# Patient Record
Sex: Female | Born: 1985 | Race: White | Hispanic: No | Marital: Married | Smoking: Never smoker
Health system: Southern US, Community
[De-identification: ages and names within clinical notes are randomized; demographics above are authoritative.]

## PROBLEM LIST (undated history)

## (undated) DIAGNOSIS — Z973 Presence of spectacles and contact lenses: Secondary | ICD-10-CM

## (undated) HISTORY — PX: APPENDECTOMY: SHX54

---

## 2007-04-08 ENCOUNTER — Emergency Department (HOSPITAL_COMMUNITY): Admission: EM | Admit: 2007-04-08 | Discharge: 2007-04-08 | Payer: Self-pay | Admitting: Emergency Medicine

## 2007-09-09 ENCOUNTER — Emergency Department (HOSPITAL_COMMUNITY): Admission: EM | Admit: 2007-09-09 | Discharge: 2007-09-09 | Payer: Self-pay | Admitting: *Deleted

## 2010-10-03 ENCOUNTER — Observation Stay: Payer: Self-pay | Admitting: Vascular Surgery

## 2010-10-06 LAB — PATHOLOGY REPORT

## 2011-12-22 ENCOUNTER — Ambulatory Visit (INDEPENDENT_AMBULATORY_CARE_PROVIDER_SITE_OTHER): Payer: BC Managed Care – PPO | Admitting: Family Medicine

## 2011-12-22 ENCOUNTER — Other Ambulatory Visit (HOSPITAL_COMMUNITY)
Admission: RE | Admit: 2011-12-22 | Discharge: 2011-12-22 | Disposition: A | Discharge status: Home / Self Care | Payer: BC Managed Care – PPO | Source: Ambulatory Visit | Attending: Family Medicine | Admitting: Family Medicine

## 2011-12-22 ENCOUNTER — Encounter: Payer: Self-pay | Admitting: Family Medicine

## 2011-12-22 VITALS — BP 110/70 | HR 60 | Temp 98.1°F | Ht 61.25 in | Wt 107.0 lb

## 2011-12-22 DIAGNOSIS — Z139 Encounter for screening, unspecified: Secondary | ICD-10-CM

## 2011-12-22 DIAGNOSIS — Z01419 Encounter for gynecological examination (general) (routine) without abnormal findings: Secondary | ICD-10-CM | POA: Insufficient documentation

## 2011-12-22 DIAGNOSIS — Z0000 Encounter for general adult medical examination without abnormal findings: Secondary | ICD-10-CM

## 2011-12-22 MED ORDER — NORGESTIMATE-ETH ESTRADIOL 0.25-35 MG-MCG PO TABS: 1.0000 | ORAL_TABLET | Freq: Every day | ORAL | Status: DC

## 2011-12-22 NOTE — Progress Notes (Signed)
Subjective:    Patient ID: Allison Carney, female    DOB: 03/25/1986, 26 y.o.   MRN: [id number]  HPI  26 yo G0  here to establish care and for CPX.  No h/o abnormal pap smears, has been married since August.  Brings in lab work from her insurer- labs are excellent!  Take Sprintec for OCPs, working well and migraines have improved since she started taking. Used to get menstrual migraine once monthly, now has a migraine every 1-2 months. Associated with Aura, visual changes, nausea and vomiting.  There is no problem list on file for this patient.  Past Medical History  Diagnosis Date  . Migraine    Past Surgical History  Procedure Date  . Appendectomy    History  Substance Use Topics  . Smoking status: Never Smoker   . Smokeless tobacco: Not on file  . Alcohol Use: Not on file   No family history on file. No Known Allergies  Current outpatient prescriptions:norgestimate-ethinyl estradiol (ORTHO-CYCLEN,SPRINTEC,PREVIFEM) 0.25-35 MG-MCG tablet, Take 1 tablet by mouth daily., Disp: 1 Package, Rfl: 11;  SUMAtriptan (IMITREX) 100 MG tablet, Take one at onset of headache., Disp: , Rfl:   The PMH, PSH, Social History, Family History, Medications, and allergies have been reviewed in [facility], and have been updated if relevant.   Review of Systems See HPI Patient reports no  vision/ hearing changes,anorexia, weight change, fever ,adenopathy, persistant / recurrent hoarseness, swallowing issues, chest pain, edema,persistant / recurrent cough, hemoptysis, dyspnea(rest, exertional, paroxysmal nocturnal), gastrointestinal  bleeding (melena, rectal bleeding), abdominal pain, excessive heart burn, GU symptoms(dysuria, hematuria, pyuria, voiding/incontinence  Issues) syncope, focal weakness, severe memory loss, concerning skin lesions, depression, anxiety, abnormal bruising/bleeding, major joint swelling, breast masses or abnormal vaginal bleeding.       Objective:   Physical Exam BP 110/70   Pulse 60  Temp(Src) 98.1 F (36.7 C) (Oral)  Ht 5' 1.25" (1.556 m)  Wt 107 lb (48.535 kg)  BMI 20.05 kg/m2  General:  Well-developed,well-nourished,in no acute distress; alert,appropriate and cooperative throughout examination Head:  normocephalic and atraumatic.   Eyes:  vision grossly intact, pupils equal, pupils round, and pupils reactive to light.   Ears:  R ear normal and L ear normal.   Nose:  no external deformity.   Mouth:  good dentition.   Neck:  No deformities, masses, or tenderness noted. Breasts:  No mass, nodules, thickening, tenderness, bulging, retraction, inflamation, nipple discharge or skin changes noted.   Lungs:  Normal respiratory effort, chest expands symmetrically. Lungs are clear to auscultation, no crackles or wheezes. Heart:  Normal rate and regular rhythm. S1 and S2 normal without gallop, murmur, click, rub or other extra sounds. Abdomen:  Bowel sounds positive,abdomen soft and non-tender without masses, organomegaly or hernias noted. Rectal:  no external abnormalities.   Genitalia:  Pelvic Exam:        External: normal female genitalia without lesions or masses        Vagina: normal without lesions or masses        Cervix: normal without lesions or masses        Adnexa: normal bimanual exam without masses or fullness        Uterus: normal by palpation        Pap smear: performed Msk:  No deformity or scoliosis noted of thoracic or lumbar spine.   Extremities:  No clubbing, cyanosis, edema, or deformity noted with normal full range of motion of all joints.   Neurologic:  alert &  oriented X3 and gait normal.   Skin:  Intact without suspicious lesions or rashes Cervical Nodes:  No lymphadenopathy noted Axillary Nodes:  No palpable lymphadenopathy Psych:  Cognition and judgment appear intact. Alert and cooperative with normal attention span and concentration. No apparent delusions, illusions, hallucinations  Assessment and Plan:         Assessment &  Plan:   1. Unspecified general medical examination  Cytology - PAP   Reviewed preventive care protocols, scheduled due services, and updated immunizations Discussed nutrition, exercise, diet, and healthy lifestyle.

## 2011-12-22 NOTE — Patient Instructions (Signed)
Wonderful to meet you. We will call you or send a letter with your pap smear results (can take 1-2 weeks). Please keep a headache journal for me and call me in a few months.

## 2011-12-27 ENCOUNTER — Encounter: Payer: Self-pay | Admitting: *Deleted

## 2012-01-12 ENCOUNTER — Encounter: Payer: Self-pay | Admitting: Family Medicine

## 2012-01-12 ENCOUNTER — Ambulatory Visit (INDEPENDENT_AMBULATORY_CARE_PROVIDER_SITE_OTHER): Payer: BC Managed Care – PPO | Admitting: Family Medicine

## 2012-01-12 VITALS — BP 118/62 | HR 74 | Temp 98.1°F | Ht 61.25 in | Wt 111.2 lb

## 2012-01-12 DIAGNOSIS — A499 Bacterial infection, unspecified: Secondary | ICD-10-CM

## 2012-01-12 DIAGNOSIS — J329 Chronic sinusitis, unspecified: Secondary | ICD-10-CM

## 2012-01-12 DIAGNOSIS — H6122 Impacted cerumen, left ear: Secondary | ICD-10-CM

## 2012-01-12 DIAGNOSIS — H6120 Impacted cerumen, unspecified ear: Secondary | ICD-10-CM

## 2012-01-12 DIAGNOSIS — B9689 Other specified bacterial agents as the cause of diseases classified elsewhere: Secondary | ICD-10-CM | POA: Insufficient documentation

## 2012-01-12 MED ORDER — AMOXICILLIN-POT CLAVULANATE 875-125 MG PO TABS: 1.0000 | ORAL_TABLET | Freq: Two times a day (BID) | ORAL | Status: AC

## 2012-01-12 NOTE — Assessment & Plan Note (Signed)
Deep and dry appearing inst to use debrox in L ear twice weekly for 2-4 wk and f/u for irrigation

## 2012-01-12 NOTE — Patient Instructions (Addendum)
Take augmentin for sinus infection and finish it all  Keep trying to use nasal saline or netti pot Naproxen is ok - for facial pain and congestion  Drink lots of fluids  Breathe stream and use warm compresses on face  Update if not starting to improve in a week or if worsening   Also get debrox ear solution over the counter and use as directed in left ear twice a week to loosen wax and then follow up with DR Dayton Martes in 2-4 weeks to have ear irrigated  Use back up birth control while on the antibiotic

## 2012-01-12 NOTE — Assessment & Plan Note (Signed)
Will tx with augmentin Pt aware of pot interaction with OC and inst to use condoms also Disc symptomatic care - see instructions on AVS  Update if not starting to improve in a week or if worsening

## 2012-01-12 NOTE — Progress Notes (Signed)
  Subjective:    Patient ID: Allison Carney, female    DOB: [birth year], 26 y.o.   MRN: [id number]  HPI Started getting uri symptoms over a week ago  Nose is congested and L nostril feels very swollen Yellow and green nasal drainage  Pain in face is bad - worse under her eyes  No fever   A little cough- not too bad so far/ non prod  Throat is a bit sore Ears feel full- no pain or drainage   Does have ear wax too  Cannot hear well out of her L ear and it feels fullness/ pressure  No pain   Patient Active Problem List  Diagnoses  . Unspecified general medical examination  . Sinusitis, bacterial  . Impacted cerumen of left ear   Past Medical History  Diagnosis Date  . Migraine    Past Surgical History  Procedure Date  . Appendectomy    History  Substance Use Topics  . Smoking status: Never Smoker   . Smokeless tobacco: Not on file  . Alcohol Use: Not on file   No family history on file. No Known Allergies Current Outpatient Prescriptions on File Prior to Visit  Medication Sig Dispense Refill  . norgestimate-ethinyl estradiol (ORTHO-CYCLEN,SPRINTEC,PREVIFEM) 0.25-35 MG-MCG tablet Take 1 tablet by mouth daily.  1 Package  11  . SUMAtriptan (IMITREX) 100 MG tablet Take one at onset of headache.         Review of Systems Review of Systems  Constitutional: Negative for fever, appetite change, and unexpected weight change. pos for fatigue  Eyes: Negative for pain and visual disturbance.  ENT pos for congestion/ rhinorrhea/ purulent drainage and facial pain and reduced hearing  Respiratory: Negative for sob or wheeze .   Cardiovascular: Negative for cp or palpitations    Gastrointestinal: Negative for nausea, diarrhea and constipation.  Genitourinary: Negative for urgency and frequency.  Skin: Negative for pallor or rash   Neurological: Negative for weakness, light-headedness, numbness and headaches.  Hematological: Negative for adenopathy. Does not bruise/bleed easily.   Psychiatric/Behavioral: Negative for dysphoric mood. The patient is not nervous/anxious.          Objective:   Physical Exam  Constitutional: She appears well-developed and well-nourished. No distress.  HENT:  Head: Normocephalic and atraumatic.  Right Ear: External ear normal.  Nose: Nose normal.  Mouth/Throat: Oropharynx is clear and moist.       Deep/ dry cerumen impaction L ear with reduced hearing  R TM clear Nares are injected and congested  bilat maxillary sinus tenderness Post nasal drip  Eyes: Conjunctivae and EOM are normal. Pupils are equal, round, and reactive to light. Right eye exhibits no discharge. Left eye exhibits no discharge.  Neck: Normal range of motion. Neck supple. No JVD present. No thyromegaly present.  Cardiovascular: Normal rate and regular rhythm.   Pulmonary/Chest: Effort normal and breath sounds normal. No respiratory distress. She has no wheezes. She has no rales.  Lymphadenopathy:    She has no cervical adenopathy.  Neurological: She is alert. She has normal reflexes. No cranial nerve deficit.  Skin: Skin is warm and dry. No rash noted.  Psychiatric: She has a normal mood and affect.          Assessment & Plan:

## 2012-02-04 ENCOUNTER — Ambulatory Visit: Payer: BC Managed Care – PPO | Admitting: Family Medicine

## 2012-02-07 ENCOUNTER — Ambulatory Visit (INDEPENDENT_AMBULATORY_CARE_PROVIDER_SITE_OTHER): Payer: BC Managed Care – PPO | Admitting: Family Medicine

## 2012-02-07 ENCOUNTER — Encounter: Payer: Self-pay | Admitting: Family Medicine

## 2012-02-07 VITALS — BP 102/70 | HR 64 | Temp 97.6°F | Wt 108.0 lb

## 2012-02-07 DIAGNOSIS — H6120 Impacted cerumen, unspecified ear: Secondary | ICD-10-CM

## 2012-02-07 NOTE — Progress Notes (Signed)
  Subjective:    Patient ID: Allison Carney, female    DOB: [birth year], 26 y.o.   MRN: [id number]  HPI 25 here for left cerumen impaction.    Has been using debrox with no improvement.  Cannot hear well out of her L ear and it feels fullness/ pressure  No pain   Patient Active Problem List  Diagnoses  . Unspecified general medical examination  . Sinusitis, bacterial  . Impacted cerumen of left ear   Past Medical History  Diagnosis Date  . Migraine    Past Surgical History  Procedure Date  . Appendectomy    History  Substance Use Topics  . Smoking status: Never Smoker   . Smokeless tobacco: Not on file  . Alcohol Use: Not on file   No family history on file. No Known Allergies Current Outpatient Prescriptions on File Prior to Visit  Medication Sig Dispense Refill  . norgestimate-ethinyl estradiol (ORTHO-CYCLEN,SPRINTEC,PREVIFEM) 0.25-35 MG-MCG tablet Take 1 tablet by mouth daily.  1 Package  11  . SUMAtriptan (IMITREX) 100 MG tablet Take one at onset of headache.         Review of Systems See HPI        Objective:   Physical Exam  BP 102/70  Pulse 64  Temp(Src) 97.6 F (36.4 C) (Oral)  Wt 108 lb (48.988 kg)  Constitutional: She appears well-developed and well-nourished. No distress.  HENT:  Head: Normocephalic and atraumatic.  Right Ear: External ear normal.  Nose: Nose normal.  Mouth/Throat: Oropharynx is clear and moist.       Deep/ dry cerumen impaction L ear with reduced hearing  Skin: Skin is warm and dry. No rash noted.  Psychiatric: She has a normal mood and affect.       Assessment & Plan:  1.  Cerumen impaction, left-  New-  Wax is removed by syringing and manual debridement. Instructions for home care to prevent wax buildup are given.

## 2012-10-29 ENCOUNTER — Encounter (HOSPITAL_COMMUNITY): Payer: Self-pay

## 2012-10-29 ENCOUNTER — Emergency Department (HOSPITAL_COMMUNITY)
Admission: EM | Admit: 2012-10-29 | Discharge: 2012-10-29 | Disposition: A | Discharge status: Home / Self Care | Payer: BC Managed Care – PPO | Attending: Emergency Medicine | Admitting: Emergency Medicine

## 2012-10-29 DIAGNOSIS — J029 Acute pharyngitis, unspecified: Secondary | ICD-10-CM | POA: Insufficient documentation

## 2012-10-29 DIAGNOSIS — R509 Fever, unspecified: Secondary | ICD-10-CM | POA: Insufficient documentation

## 2012-10-29 DIAGNOSIS — J3489 Other specified disorders of nose and nasal sinuses: Secondary | ICD-10-CM | POA: Insufficient documentation

## 2012-10-29 DIAGNOSIS — Z8679 Personal history of other diseases of the circulatory system: Secondary | ICD-10-CM | POA: Insufficient documentation

## 2012-10-29 MED ORDER — LIDOCAINE VISCOUS 2 % MT SOLN: 15.0000 mL | OROMUCOSAL | Status: DC | PRN

## 2012-10-29 NOTE — ED Provider Notes (Signed)
History   This chart was scribed for Wynetta Emery, PA by Leone Payor, ED Scribe. This patient was seen in room TR08C/TR08C and the patient's care was started at 2232  CSN: [id number]  Arrival date & time 10/29/12  2122   First MD Initiated Contact with Patient 10/29/12 2232      Chief Complaint  Patient presents with  . Sore Throat     The history is provided by the patient. No language interpreter was used.    Allison Carney is a 27 y.o. female who presents to the Emergency Department complaining of a new, unchanged sore throat starting 1 day ago with associated fever (TMAX 101) and nasal drainage. Pt reports taking alka seltzer and day time nasal congestion medication with mild relief. She denies cough, nausea, vomiting, diarrhea.   Pt has h/o migraines and appendectomy.  Pt denies smoking and alcohol use.  Past Medical History  Diagnosis Date  . Migraine     Past Surgical History  Procedure Date  . Appendectomy     History reviewed. No pertinent family history.  History  Substance Use Topics  . Smoking status: Never Smoker   . Smokeless tobacco: Not on file  . Alcohol Use: No    No OB history provided.   Review of Systems  Constitutional: Positive for fever.  HENT: Positive for sore throat.   Respiratory: Negative for cough and shortness of breath.   Cardiovascular: Negative for chest pain.  Gastrointestinal: Negative for nausea, vomiting, abdominal pain and diarrhea.  All other systems reviewed and are negative.    Allergies  Review of patient's allergies indicates no known allergies.  Home Medications   Current Outpatient Rx  Name  Route  Sig  Dispense  Refill  . FEXOFENADINE-PSEUDOEPHED ER 60-120 MG PO TB12   Oral   Take 1 tablet by mouth daily as needed. For allergies         . NORGESTIMATE-ETH ESTRADIOL 0.25-35 MG-MCG PO TABS   Oral   Take 1 tablet by mouth daily.   1 Package   11   . SUMATRIPTAN SUCCINATE 100 MG PO TABS   Oral  Take 100 mg by mouth every 2 (two) hours as needed. For migraines           BP 128/91  Pulse 103  Temp 99.3 F (37.4 C) (Oral)  Resp 18  SpO2 100%  LMP 10/29/2012  Physical Exam  Nursing note and vitals reviewed. Constitutional: She is oriented to person, place, and time. She appears well-developed and well-nourished. No distress.  HENT:  Head: Normocephalic.  Right Ear: External ear normal.  Left Ear: External ear normal.  Nose: Nose normal.  Mouth/Throat: Oropharynx is clear and moist. No oropharyngeal exudate.  Eyes: Conjunctivae normal and EOM are normal. Pupils are equal, round, and reactive to light.  Neck: Normal range of motion. Neck supple.  Cardiovascular: Normal rate.   Pulmonary/Chest: Effort normal and breath sounds normal. No stridor. No respiratory distress. She has no wheezes. She has no rales. She exhibits no tenderness.  Abdominal: Soft. Bowel sounds are normal. She exhibits no distension and no mass. There is no tenderness. There is no rebound and no guarding.  Musculoskeletal: Normal range of motion.  Lymphadenopathy:    She has no cervical adenopathy.  Neurological: She is alert and oriented to person, place, and time.  Psychiatric: She has a normal mood and affect.    ED Course  Procedures (including critical care time)  DIAGNOSTIC STUDIES:  Oxygen Saturation is 100% on room air, normal by my interpretation.    COORDINATION OF CARE:   11:11 PM Discussed treatment plan which includes high dose of motrin and viscous lidocaine with pt at bedside and pt agreed to plan.     Labs Reviewed  RAPID STREP SCREEN  LAB REPORT - SCANNED   No results found.   1. Pharyngitis       MDM    Pt verbalized understanding and agrees with care plan. Outpatient follow-up and return precautions given.    New Prescriptions   LIDOCAINE (XYLOCAINE) 2 % SOLUTION    Take 15 mLs by mouth every 3 (three) hours as needed for pain. Swish and swallow    I  personally performed the services described in this documentation, which was scribed in my presence. The recorded information has been reviewed and is accurate.   Wynetta Emery, PA-C 11/01/12 [phone]

## 2012-10-29 NOTE — ED Notes (Signed)
Pt reports low grade fever, sore throat and nasal drainage starting last night. Pt sts "my throat burns."

## 2012-11-02 NOTE — ED Provider Notes (Signed)
Medical screening examination/treatment/procedure(s) were performed by non-physician practitioner and as supervising physician I was immediately available for consultation/collaboration.   Jahniya Duzan E Itzell Bendavid, MD 11/02/12 0838 

## 2012-11-22 ENCOUNTER — Other Ambulatory Visit: Payer: Self-pay | Admitting: Family Medicine

## 2013-01-17 ENCOUNTER — Other Ambulatory Visit: Payer: Self-pay | Admitting: Family Medicine

## 2013-02-17 ENCOUNTER — Other Ambulatory Visit: Payer: Self-pay | Admitting: Family Medicine

## 2013-04-11 ENCOUNTER — Other Ambulatory Visit: Payer: Self-pay | Admitting: Family Medicine

## 2013-04-16 ENCOUNTER — Ambulatory Visit (INDEPENDENT_AMBULATORY_CARE_PROVIDER_SITE_OTHER): Payer: BC Managed Care – PPO | Admitting: Family Medicine

## 2013-04-16 ENCOUNTER — Encounter: Payer: Self-pay | Admitting: Family Medicine

## 2013-04-16 VITALS — BP 120/70 | HR 60 | Temp 97.9°F | Ht 61.25 in | Wt 102.0 lb

## 2013-04-16 DIAGNOSIS — Z136 Encounter for screening for cardiovascular disorders: Secondary | ICD-10-CM

## 2013-04-16 DIAGNOSIS — Z0000 Encounter for general adult medical examination without abnormal findings: Secondary | ICD-10-CM

## 2013-04-16 LAB — LIPID PANEL
Cholesterol: 237 mg/dL — ABNORMAL HIGH (ref 0–200)
VLDL: 20.4 mg/dL (ref 0.0–40.0)

## 2013-04-16 LAB — COMPREHENSIVE METABOLIC PANEL
ALT: 10 U/L (ref 0–35)
AST: 21 U/L (ref 0–37)
Alkaline Phosphatase: 29 U/L — ABNORMAL LOW (ref 39–117)
BUN: 13 mg/dL (ref 6–23)
CO2: 21 mEq/L (ref 19–32)
Sodium: 139 mEq/L (ref 135–145)
Total Bilirubin: 0.4 mg/dL (ref 0.3–1.2)
Total Protein: 7.3 g/dL (ref 6.0–8.3)

## 2013-04-16 LAB — LDL CHOLESTEROL, DIRECT: Direct LDL: 154.1 mg/dL

## 2013-04-16 MED ORDER — NORGESTIMATE-ETH ESTRADIOL 0.25-35 MG-MCG PO TABS: ORAL_TABLET | ORAL | Status: DC

## 2013-04-16 NOTE — Patient Instructions (Addendum)
Health Maintenance, Females A healthy lifestyle and preventative care can promote health and wellness.  Maintain regular health, dental, and eye exams.  Eat a healthy diet. Foods like vegetables, fruits, whole grains, low-fat dairy products, and lean protein foods contain the nutrients you need without too many calories. Decrease your intake of foods high in solid fats, added sugars, and salt. Get information about a proper diet from your caregiver, if necessary.  Regular physical exercise is one of the most important things you can do for your health. Most adults should get at least 150 minutes of moderate-intensity exercise (any activity that increases your heart rate and causes you to sweat) each week. In addition, most adults need muscle-strengthening exercises on 2 or more days a week.   Maintain a healthy weight. The body mass index (BMI) is a screening tool to identify possible weight problems. It provides an estimate of body fat based on height and weight. Your caregiver can help determine your BMI, and can help you achieve or maintain a healthy weight. For adults 20 years and older:  A BMI below 18.5 is considered underweight.  A BMI of 18.5 to 24.9 is normal.  A BMI of 25 to 29.9 is considered overweight.  A BMI of 30 and above is considered obese.  Maintain normal blood lipids and cholesterol by exercising and minimizing your intake of saturated fat. Eat a balanced diet with plenty of fruits and vegetables.   If you are pregnant, do not drink alcohol. If you are breastfeeding, be very cautious about drinking alcohol. If you are not pregnant and choose to drink alcohol, do not exceed 1 drink per day. One drink is considered to be 12 ounces (355 mL) of beer, 5 ounces (148 mL) of wine, or 1.5 ounces (44 mL) of liquor.  Avoid use of street drugs. Do not share needles with anyone. Ask for help if you need support or instructions about stopping the use of drugs.  The Pap test is a  screening test for cervical cancer. Women should have a Pap test starting at age 35. Between ages 32 and 68, Pap tests should be repeated every 2 years. Beginning at age 61, you should have a Pap test every 3 years as long as the past 3 Pap tests have been normal. If you had a hysterectomy for a problem that was not cancer or a condition that could lead to cancer, then you no longer need Pap tests. If you are between ages 74 and 16, and you have had normal Pap tests going back 10 years, you no longer need Pap tests. If you have had past treatment for cervical cancer or a condition that could lead to cancer, you need Pap tests and screening for cancer for at least 20 years after your treatment. If Pap tests have been discontinued, risk factors (such as a new sexual partner) need to be reassessed to determine if screening should be resumed. Some women have medical problems that increase the chance of getting cervical cancer. In these cases, your caregiver may recommend more frequent screening and Pap tests.  The human papillomavirus (HPV) test is an additional test that may be used for cervical cancer screening. The HPV test looks for the virus that can cause the cell changes on the cervix. The cells collected during the Pap test can be tested for HPV. The HPV test could be used to screen women aged 107 years and older, and should be used in women of any age  who have unclear Pap test results. After the age of 80, women should have HPV testing at the same frequency as a Pap test.  Use sunscreen with a sun protection factor (SPF) of 30 or greater. Apply sunscreen liberally and repeatedly throughout the day. You should seek shade when your shadow is shorter than you. Protect yourself by wearing long sleeves, pants, a wide-brimmed hat, and sunglasses year round, whenever you are outdoors.  Notify your caregiver of new moles or changes in moles, especially if there is a change in shape or color. Also notify your  caregiver if a mole is larger than the size of a pencil eraser.  Stay current with your immunizations. Document Released: 04/19/2011 Document Revised: 12/27/2011 Document Reviewed: 04/19/2011 [facility] Patient Information 2014 [place], [place].

## 2013-04-16 NOTE — Progress Notes (Signed)
Subjective:    Patient ID: Allison Carney, female    DOB: 02-05-1986, 27 y.o.   MRN: [id number]  HPI  27 yo G0  here for CPX.  No h/o abnormal pap smears, sexually active with husband only.  Husband just graduated from grad school and they are doing well.  Last pap smear done by me in 12/2011 was normal.  Take Sprintec for OCPs, working well and migraines have improved since she started taking. Used to get menstrual migraine once monthly, now has a migraine every 1-2 months. Associated with Aura, visual changes, nausea and vomiting.  Patient Active Problem List   Diagnosis Date Noted  . Unspecified general medical examination 12/22/2011   Past Medical History  Diagnosis Date  . Migraine    Past Surgical History  Procedure Laterality Date  . Appendectomy     History  Substance Use Topics  . Smoking status: Never Smoker   . Smokeless tobacco: Not on file  . Alcohol Use: No   No family history on file. No Known Allergies  Current outpatient prescriptions:fexofenadine-pseudoephedrine (ALLEGRA-D) 60-120 MG per tablet, Take 1 tablet by mouth daily as needed. For allergies, Disp: , Rfl: ;  lidocaine (XYLOCAINE) 2 % solution, Take 15 mLs by mouth every 3 (three) hours as needed for pain. Swish and swallow, Disp: 100 mL, Rfl: 0;  norgestimate-ethinyl estradiol (SPRINTEC 28) 0.25-35 MG-MCG tablet, TAKE 1 TABLET BY MOUTH DAILY, Disp: 28 tablet, Rfl: 0 SPRINTEC 28 0.25-35 MG-MCG tablet, TAKE 1 TABLET BY MOUTH DAILY, Disp: 28 tablet, Rfl: 1;  SUMAtriptan (IMITREX) 100 MG tablet, Take 100 mg by mouth every 2 (two) hours as needed. For migraines, Disp: , Rfl:   The PMH, PSH, Social History, Family History, Medications, and allergies have been reviewed in [facility], and have been updated if relevant.   Review of Systems See HPI Patient reports no  vision/ hearing changes,anorexia, weight change, fever ,adenopathy, persistant / recurrent hoarseness, swallowing issues, chest pain,  edema,persistant / recurrent cough, hemoptysis, dyspnea(rest, exertional, paroxysmal nocturnal), gastrointestinal  bleeding (melena, rectal bleeding), abdominal pain, excessive heart burn, GU symptoms(dysuria, hematuria, pyuria, voiding/incontinence  Issues) syncope, focal weakness, severe memory loss, concerning skin lesions, depression, anxiety, abnormal bruising/bleeding, major joint swelling, breast masses or abnormal vaginal bleeding.       Objective:   Physical Exam BP 120/70  Pulse 60  Temp(Src) 97.9 F (36.6 C)  Ht 5' 1.25" (1.556 m)  Wt 102 lb (46.267 kg)  BMI 19.11 kg/m2  General:  Well-developed,well-nourished,in no acute distress; alert,appropriate and cooperative throughout examination Head:  normocephalic and atraumatic.   Eyes:  vision grossly intact, pupils equal, pupils round, and pupils reactive to light.   Ears:  R ear normal and L ear normal.   Nose:  no external deformity.   Mouth:  good dentition.   Neck:  No deformities, masses, or tenderness noted. Breasts:  No mass, nodules, thickening, tenderness, bulging, retraction, inflamation, nipple discharge or skin changes noted.   Lungs:  Normal respiratory effort, chest expands symmetrically. Lungs are clear to auscultation, no crackles or wheezes. Heart:  Normal rate and regular rhythm. S1 and S2 normal without gallop, murmur, click, rub or other extra sounds. Abdomen:  Bowel sounds positive,abdomen soft and non-tender without masses, organomegaly or hernias noted. Msk:  No deformity or scoliosis noted of thoracic or lumbar spine.   Extremities:  No clubbing, cyanosis, edema, or deformity noted with normal full range of motion of all joints.   Neurologic:  alert & oriented  X3 and gait normal.   Skin:  Intact without suspicious lesions or rashes Cervical Nodes:  No lymphadenopathy noted Axillary Nodes:  No palpable lymphadenopathy Psych:  Cognition and judgment appear intact. Alert and cooperative with normal attention  span and concentration. No apparent delusions, illusions, hallucinations     Assessment & Plan:   1. Unspecified general medical examination Reviewed preventive care protocols, scheduled due services, and updated immunizations Discussed nutrition, exercise, diet, and healthy lifestyle.  Pap smear next year.  - Comprehensive metabolic panel  2. Screening for ischemic heart disease  - Lipid Panel

## 2013-08-23 ENCOUNTER — Other Ambulatory Visit: Payer: Self-pay

## 2013-10-08 ENCOUNTER — Encounter: Payer: Self-pay | Admitting: Family Medicine

## 2013-10-08 ENCOUNTER — Ambulatory Visit (INDEPENDENT_AMBULATORY_CARE_PROVIDER_SITE_OTHER): Payer: BC Managed Care – PPO | Admitting: Family Medicine

## 2013-10-08 VITALS — BP 102/60 | HR 77 | Temp 98.1°F | Wt 108.2 lb

## 2013-10-08 DIAGNOSIS — J019 Acute sinusitis, unspecified: Secondary | ICD-10-CM

## 2013-10-08 MED ORDER — AMOXICILLIN-POT CLAVULANATE 875-125 MG PO TABS: 1.0000 | ORAL_TABLET | Freq: Two times a day (BID) | ORAL | Status: DC

## 2013-10-08 NOTE — Patient Instructions (Signed)
Drink plenty of fluids, take tylenol as needed, and gargle with warm salt water for your throat.  Start the antibiotics today.  This should gradually improve.  Take care.  Let us know if you have other concerns.    

## 2013-10-08 NOTE — Progress Notes (Signed)
Pre-visit discussion using our clinic review tool. No additional management support is needed unless otherwise documented below in the visit note.  Sx started about 1 week ago.  ST, inc sinus pressure, post nasal gtt. Voice change noted.  Maxillary but not mandible pain B.  Taking baseline meds with mucinex added on.  L > R ear pain.  Cough- mild, with some sputum.    Meds, vitals, and allergies reviewed.   ROS: See HPI.  Otherwise, noncontributory.  GEN: nad, alert and oriented HEENT: mucous membranes moist, tm w/o erythema, nasal exam w/o erythema, clear discharge noted,  OP with cobblestoning, max sinuses ttp B NECK: supple w/shotty B LA CV: rrr.   PULM: ctab, no inc wob EXT: no edema SKIN: no acute rash

## 2013-10-08 NOTE — Assessment & Plan Note (Signed)
Nontoxic, would treat given exam and duration.  Back up birth control d/w pt. F/u prn. Supportive care o/w.

## 2014-03-29 ENCOUNTER — Ambulatory Visit (INDEPENDENT_AMBULATORY_CARE_PROVIDER_SITE_OTHER): Payer: BC Managed Care – PPO | Admitting: Family Medicine

## 2014-03-29 ENCOUNTER — Encounter: Payer: Self-pay | Admitting: Family Medicine

## 2014-03-29 VITALS — BP 104/62 | HR 88 | Temp 97.7°F | Wt 104.0 lb

## 2014-03-29 DIAGNOSIS — Z7189 Other specified counseling: Secondary | ICD-10-CM | POA: Insufficient documentation

## 2014-03-29 DIAGNOSIS — Z23 Encounter for immunization: Secondary | ICD-10-CM

## 2014-03-29 MED ORDER — SUMATRIPTAN SUCCINATE 100 MG PO TABS: 100.0000 mg | ORAL_TABLET | ORAL | Status: DC | PRN

## 2014-03-29 MED ORDER — CIPROFLOXACIN HCL 500 MG PO TABS: 500.0000 mg | ORAL_TABLET | Freq: Two times a day (BID) | ORAL | Status: DC

## 2014-03-29 MED ORDER — CIPROFLOXACIN HCL 500 MG PO TABS
500.0000 mg | ORAL_TABLET | Freq: Two times a day (BID) | ORAL | Status: DC
Start: 2014-03-29 — End: 2014-08-12

## 2014-03-29 NOTE — Progress Notes (Signed)
   Subjective:   Patient ID: Allison Carney, female    DOB: [birth year], 28 y.o.   MRN: [id number]  Allison MinceJessica N Carney is a pleasant 28 y.o. year old female who presents to clinic today with Medication Refill  on 03/29/2014  HPI: Leaving for [place] tomorrow for grad school. Will be there for 8 days. Due for Tdap.  Already received her Hep A and Typhoid vaccines.  She is interested abx as well.  Current Outpatient Prescriptions on File Prior to Visit  Medication Sig Dispense Refill  . fexofenadine-pseudoephedrine (ALLEGRA-D) 60-120 MG per tablet Take 1 tablet by mouth daily as needed. For allergies      . norgestimate-ethinyl estradiol (SPRINTEC 28) 0.25-35 MG-MCG tablet TAKE 1 TABLET BY MOUTH DAILY  28 tablet  11   No current facility-administered medications on file prior to visit.    No Known Allergies  Past Medical History  Diagnosis Date  . Migraine     Past Surgical History  Procedure Laterality Date  . Appendectomy      No family history on file.  History   Social History  . Marital Status: Single    Spouse Name: N/A    Number of Children: N/A  . Years of Education: N/A   Occupational History  . Not on file.   Social History Main Topics  . Smoking status: Never Smoker   . Smokeless tobacco: Not on file  . Alcohol Use: No  . Drug Use: No  . Sexual Activity: Not on file   Other Topics Concern  . 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   The PMH, PSH, Social History, Family History, Medications, and allergies have been reviewed in [facility], and have been updated if relevant.   Review of Systems    See HPI Objective:    BP 104/62  Pulse 88  Temp(Src) 97.7 F (36.5 C) (Oral)  Wt 104 lb (47.174 kg)  SpO2 96%  LMP 03/11/2014   Physical Exam  Gen:  Alert, pleasant, NAD Psych:  Good eye contact Not anxious or depressed appearing      Assessment & Plan:   Counseling on health  promotion and disease prevention No Follow-up on file.

## 2014-03-29 NOTE — Addendum Note (Signed)
Addended by: Desmond DikeKNIGHT, Treshawn Allen H on: 03/29/2014 03:16 PM   Modules accepted: Orders

## 2014-03-29 NOTE — Assessment & Plan Note (Signed)
>  15 minutes spent in face to face time with patient, >50% spent in counselling or coordination of care Tdap given today. Rx given for cipro 500 mg twice daily for prn traveler's diarrhea.

## 2014-03-29 NOTE — Progress Notes (Signed)
Pre visit review using our clinic review tool, if applicable. No additional management support is needed unless otherwise documented below in the visit note. 

## 2014-04-10 ENCOUNTER — Other Ambulatory Visit: Payer: Self-pay | Admitting: Family Medicine

## 2014-04-29 ENCOUNTER — Other Ambulatory Visit: Payer: Self-pay | Admitting: Family Medicine

## 2014-04-30 MED ORDER — NORGESTIMATE-ETH ESTRADIOL 0.25-35 MG-MCG PO TABS: ORAL_TABLET | ORAL | Status: DC

## 2014-04-30 NOTE — Telephone Encounter (Signed)
Pt responded to via mychart. Rx sent to requested pharmacy; pt advised to schedule CPE for additional refills

## 2014-05-03 ENCOUNTER — Other Ambulatory Visit: Payer: Self-pay

## 2014-05-03 MED ORDER — NORGESTIMATE-ETH ESTRADIOL 0.25-35 MG-MCG PO TABS: ORAL_TABLET | ORAL | Status: DC

## 2014-05-03 NOTE — Telephone Encounter (Signed)
Pt left v/m requesting refill BC pill to [facility]; pt has already scheduled cpx pm 06/21/14 and that is the soonest appt that pt can come due to grad school. Advised pt refill sent and keep 06/21/14 appt. Pt voice understanding.

## 2014-05-09 ENCOUNTER — Encounter: Payer: BC Managed Care – PPO | Admitting: Family Medicine

## 2014-06-21 ENCOUNTER — Encounter: Payer: Self-pay | Admitting: *Deleted

## 2014-06-21 ENCOUNTER — Other Ambulatory Visit: Payer: Self-pay | Admitting: Family Medicine

## 2014-06-21 ENCOUNTER — Other Ambulatory Visit (INDEPENDENT_AMBULATORY_CARE_PROVIDER_SITE_OTHER): Payer: BC Managed Care – PPO

## 2014-06-21 ENCOUNTER — Encounter: Payer: BC Managed Care – PPO | Admitting: Family Medicine

## 2014-06-21 DIAGNOSIS — Z136 Encounter for screening for cardiovascular disorders: Secondary | ICD-10-CM

## 2014-06-21 DIAGNOSIS — Z0000 Encounter for general adult medical examination without abnormal findings: Secondary | ICD-10-CM

## 2014-06-21 DIAGNOSIS — Z0289 Encounter for other administrative examinations: Secondary | ICD-10-CM

## 2014-06-21 LAB — CBC WITH DIFFERENTIAL/PLATELET
Basophils Absolute: 0 10*3/uL (ref 0.0–0.1)
Basophils Relative: 0.6 % (ref 0.0–3.0)
EOS ABS: 0.1 10*3/uL (ref 0.0–0.7)
EOS PCT: 1.8 % (ref 0.0–5.0)
HCT: 39.6 % (ref 36.0–46.0)
Hemoglobin: 13.8 g/dL (ref 12.0–15.0)
LYMPHS ABS: 1.6 10*3/uL (ref 0.7–4.0)
LYMPHS PCT: 39.3 % (ref 12.0–46.0)
MCHC: 34.8 g/dL (ref 30.0–36.0)
MCV: 83.9 fl (ref 78.0–100.0)
MONO ABS: 0.4 10*3/uL (ref 0.1–1.0)
Monocytes Relative: 9.8 % (ref 3.0–12.0)
NEUTROS PCT: 48.5 % (ref 43.0–77.0)
Neutro Abs: 1.9 10*3/uL (ref 1.4–7.7)
Platelets: 276 10*3/uL (ref 150.0–400.0)
RBC: 4.72 Mil/uL (ref 3.87–5.11)
RDW: 12.5 % (ref 11.5–15.5)
WBC: 4 10*3/uL (ref 4.0–10.5)

## 2014-06-21 LAB — COMPREHENSIVE METABOLIC PANEL
ALBUMIN: 4 g/dL (ref 3.5–5.2)
ALK PHOS: 31 U/L — AB (ref 39–117)
ALT: 10 U/L (ref 0–35)
AST: 24 U/L (ref 0–37)
BILIRUBIN TOTAL: 0.5 mg/dL (ref 0.2–1.2)
BUN: 16 mg/dL (ref 6–23)
CALCIUM: 9.2 mg/dL (ref 8.4–10.5)
CO2: 26 mEq/L (ref 19–32)
CREATININE: 0.7 mg/dL (ref 0.4–1.2)
Chloride: 105 mEq/L (ref 96–112)
GFR: 114.08 mL/min (ref >60.00)
Glucose, Bld: 88 mg/dL (ref 70–99)
Potassium: 4 mEq/L (ref 3.5–5.1)
Sodium: 139 mEq/L (ref 135–145)
Total Protein: 7.3 g/dL (ref 6.0–8.3)

## 2014-06-21 LAB — LIPID PANEL
CHOL/HDL RATIO: 3
CHOLESTEROL: 231 mg/dL — AB (ref 0–200)
HDL: 69.7 mg/dL (ref >39.00)
LDL Cholesterol: 149 mg/dL — ABNORMAL HIGH (ref 0–99)
NONHDL: 161.3
Triglycerides: 62 mg/dL (ref 0.0–149.0)
VLDL: 12.4 mg/dL (ref 0.0–40.0)

## 2014-06-21 LAB — TSH: TSH: 0.99 u[IU]/mL (ref 0.35–4.50)

## 2014-08-12 ENCOUNTER — Ambulatory Visit (INDEPENDENT_AMBULATORY_CARE_PROVIDER_SITE_OTHER): Payer: BC Managed Care – PPO | Admitting: Family Medicine

## 2014-08-12 ENCOUNTER — Other Ambulatory Visit (HOSPITAL_COMMUNITY)
Admission: RE | Admit: 2014-08-12 | Discharge: 2014-08-12 | Disposition: A | Discharge status: Home / Self Care | Payer: BC Managed Care – PPO | Source: Ambulatory Visit | Attending: Family Medicine | Admitting: Family Medicine

## 2014-08-12 ENCOUNTER — Encounter: Payer: Self-pay | Admitting: Family Medicine

## 2014-08-12 VITALS — BP 112/62 | HR 85 | Temp 97.9°F | Ht 61.0 in | Wt 105.0 lb

## 2014-08-12 DIAGNOSIS — Z01419 Encounter for gynecological examination (general) (routine) without abnormal findings: Secondary | ICD-10-CM | POA: Diagnosis not present

## 2014-08-12 DIAGNOSIS — E7800 Pure hypercholesterolemia, unspecified: Secondary | ICD-10-CM

## 2014-08-12 MED ORDER — NORGESTIMATE-ETH ESTRADIOL 0.25-35 MG-MCG PO TABS: ORAL_TABLET | ORAL | Status: DC

## 2014-08-12 NOTE — Progress Notes (Signed)
Pre visit review using our clinic review tool, if applicable. No additional management support is needed unless otherwise documented below in the visit note. 

## 2014-08-12 NOTE — Addendum Note (Signed)
Addended by: Desmond DikeKNIGHT, Lakai Moree H on: 08/12/2014 12:22 PM   Modules accepted: Orders

## 2014-08-12 NOTE — Assessment & Plan Note (Signed)
Reviewed preventive care protocols, scheduled due services, and updated immunizations Discussed nutrition, exercise, diet, and healthy lifestyle.  Pap smear done today. 

## 2014-08-12 NOTE — Progress Notes (Signed)
Patient ID: Allison Carney, female   DOB: 02-11-1986, 28 y.o.   MRN: [id number]  Subjective:    Patient ID: Allison MinceJessica N Carney, female    DOB: 02-11-1986, 28 y.o.   MRN: [id number]  HPI  28 yo G0  here for CPX.  No h/o abnormal pap smears, sexually active with husband only.  Husband just graduated from college and they are doing well.  Last pap smear done by me in 12/2011 was normal.  No h/o abnormal pap smears.  Take Sprintec for OCPs, working well and migraines have improved since she started taking.  LDL cholesterol still a little elevated but she is exercising and eating right.  She does not think she has a strong FH of HLD.  Lab Results  Component Value Date   CHOL 231* 06/21/2014   HDL 69.70 06/21/2014   LDLCALC 149* 06/21/2014   LDLDIRECT 154.1 04/16/2013   TRIG 62.0 06/21/2014   CHOLHDL 3 06/21/2014     Patient Active Problem List   Diagnosis Date Noted  . Counseling on health promotion and disease prevention 03/29/2014   Past Medical History  Diagnosis Date  . Migraine    Past Surgical History  Procedure Laterality Date  . Appendectomy     History  Substance Use Topics  . Smoking status: Never Smoker   . Smokeless tobacco: Not on file  . Alcohol Use: No   No family history on file. No Known Allergies  Current outpatient prescriptions:fexofenadine-pseudoephedrine (ALLEGRA-D) 60-120 MG per tablet, Take 1 tablet by mouth daily as needed. For allergies, Disp: , Rfl: ;  norgestimate-ethinyl estradiol (SPRINTEC 28) 0.25-35 MG-MCG tablet, TAKE 1 TABLET BY MOUTH DAILY, Disp: 28 tablet, Rfl: 11;  SUMAtriptan (IMITREX) 100 MG tablet, Take 1 tablet (100 mg total) by mouth every 2 (two) hours as needed. For migraines, Disp: 10 tablet, Rfl: 1  The PMH, PSH, Social History, Family History, Medications, and allergies have been reviewed in [facility], and have been updated if relevant.   Review of Systems See HPI Patient reports no  vision/ hearing changes,anorexia, weight change, fever  ,adenopathy, persistant / recurrent hoarseness, swallowing issues, chest pain, edema,persistant / recurrent cough, hemoptysis, dyspnea(rest, exertional, paroxysmal nocturnal), gastrointestinal  bleeding (melena, rectal bleeding), abdominal pain, excessive heart burn, GU symptoms(dysuria, hematuria, pyuria, voiding/incontinence  Issues) syncope, focal weakness, severe memory loss, concerning skin lesions, depression, anxiety, abnormal bruising/bleeding, major joint swelling, breast masses or abnormal vaginal bleeding.       Objective:   Physical Exam BP 112/62  Pulse 85  Temp(Src) 97.9 F (36.6 C) (Oral)  Ht 5\' 1"  (1.549 m)  Wt 105 lb (47.628 kg)  BMI 19.85 kg/m2  SpO2 97%  LMP 07/30/2014   General:  Well-developed,well-nourished,in no acute distress; alert,appropriate and cooperative throughout examination Head:  normocephalic and atraumatic.   Eyes:  vision grossly intact, pupils equal, pupils round, and pupils reactive to light.   Ears:  R ear normal and L ear normal.   Nose:  no external deformity.   Mouth:  good dentition.   Neck:  No deformities, masses, or tenderness noted. Breasts:  No mass, nodules, thickening, tenderness, bulging, retraction, inflamation, nipple discharge or skin changes noted.   Lungs:  Normal respiratory effort, chest expands symmetrically. Lungs are clear to auscultation, no crackles or wheezes. Heart:  Normal rate and regular rhythm. S1 and S2 normal without gallop, murmur, click, rub or other extra sounds. Abdomen:  Bowel sounds positive,abdomen soft and non-tender without masses, organomegaly or hernias noted. Rectal:  no external abnormalities.   Genitalia:  Pelvic Exam:        External: normal female genitalia without lesions or masses        Vagina: normal without lesions or masses        Cervix: normal without lesions or masses        Adnexa: normal bimanual exam without masses or fullness        Uterus: normal by palpation        Pap smear:  performed Msk:  No deformity or scoliosis noted of thoracic or lumbar spine.   Extremities:  No clubbing, cyanosis, edema, or deformity noted with normal full range of motion of all joints.   Neurologic:  alert & oriented X3 and gait normal.   Skin:  Intact without suspicious lesions or rashes Cervical Nodes:  No lymphadenopathy noted Axillary Nodes:  No palpable lymphadenopathy Psych:  Cognition and judgment appear intact. Alert and cooperative with normal attention span and concentration. No apparent delusions, illusions, hallucinations      Assessment & Plan:

## 2014-08-12 NOTE — Assessment & Plan Note (Signed)
Continue to work on lifestyle changes.

## 2014-08-13 LAB — CYTOLOGY - PAP

## 2014-08-14 ENCOUNTER — Encounter: Payer: Self-pay | Admitting: *Deleted

## 2015-07-23 ENCOUNTER — Other Ambulatory Visit: Payer: Self-pay | Admitting: Family Medicine

## 2015-08-14 ENCOUNTER — Encounter: Payer: Self-pay | Admitting: Family Medicine

## 2015-08-14 ENCOUNTER — Ambulatory Visit (INDEPENDENT_AMBULATORY_CARE_PROVIDER_SITE_OTHER): Payer: BC Managed Care – PPO | Admitting: Family Medicine

## 2015-08-14 VITALS — BP 108/62 | HR 66 | Temp 97.9°F | Ht 61.0 in | Wt 106.0 lb

## 2015-08-14 DIAGNOSIS — Z0000 Encounter for general adult medical examination without abnormal findings: Secondary | ICD-10-CM

## 2015-08-14 DIAGNOSIS — Z01419 Encounter for gynecological examination (general) (routine) without abnormal findings: Secondary | ICD-10-CM

## 2015-08-14 DIAGNOSIS — Z23 Encounter for immunization: Secondary | ICD-10-CM | POA: Diagnosis not present

## 2015-08-14 DIAGNOSIS — E7800 Pure hypercholesterolemia, unspecified: Secondary | ICD-10-CM

## 2015-08-14 LAB — COMPREHENSIVE METABOLIC PANEL
ALBUMIN: 3.9 g/dL (ref 3.5–5.2)
ALK PHOS: 42 U/L (ref 39–117)
ALT: 8 U/L (ref 0–35)
AST: 16 U/L (ref 0–37)
BUN: 12 mg/dL (ref 6–23)
CALCIUM: 9.4 mg/dL (ref 8.4–10.5)
CO2: 26 mEq/L (ref 19–32)
CREATININE: 0.6 mg/dL (ref 0.40–1.20)
Chloride: 103 mEq/L (ref 96–112)
GFR: 126.28 mL/min (ref >60.00)
Glucose, Bld: 84 mg/dL (ref 70–99)
POTASSIUM: 4.1 meq/L (ref 3.5–5.1)
SODIUM: 138 meq/L (ref 135–145)
TOTAL PROTEIN: 7.4 g/dL (ref 6.0–8.3)
Total Bilirubin: 0.5 mg/dL (ref 0.2–1.2)

## 2015-08-14 LAB — CBC WITH DIFFERENTIAL/PLATELET
Basophils Absolute: 0 10*3/uL (ref 0.0–0.1)
Basophils Relative: 0.4 % (ref 0.0–3.0)
EOS PCT: 0.2 % (ref 0.0–5.0)
Eosinophils Absolute: 0 10*3/uL (ref 0.0–0.7)
HEMATOCRIT: 39.2 % (ref 36.0–46.0)
HEMOGLOBIN: 13.5 g/dL (ref 12.0–15.0)
LYMPHS PCT: 22.8 % (ref 12.0–46.0)
Lymphs Abs: 1.7 10*3/uL (ref 0.7–4.0)
MCHC: 34.4 g/dL (ref 30.0–36.0)
MCV: 87.3 fl (ref 78.0–100.0)
MONO ABS: 0.4 10*3/uL (ref 0.1–1.0)
Monocytes Relative: 4.9 % (ref 3.0–12.0)
Neutro Abs: 5.2 10*3/uL (ref 1.4–7.7)
Neutrophils Relative %: 71.7 % (ref 43.0–77.0)
Platelets: 361 10*3/uL (ref 150.0–400.0)
RBC: 4.49 Mil/uL (ref 3.87–5.11)
RDW: 12.8 % (ref 11.5–15.5)
WBC: 7.3 10*3/uL (ref 4.0–10.5)

## 2015-08-14 LAB — LIPID PANEL
CHOLESTEROL: 216 mg/dL — AB (ref 0–200)
HDL: 60 mg/dL (ref >39.00)
LDL Cholesterol: 130 mg/dL — ABNORMAL HIGH (ref 0–99)
NonHDL: 155.53
Total CHOL/HDL Ratio: 4
Triglycerides: 130 mg/dL (ref 0.0–149.0)
VLDL: 26 mg/dL (ref 0.0–40.0)

## 2015-08-14 LAB — HIV ANTIBODY (ROUTINE TESTING W REFLEX): HIV: NONREACTIVE

## 2015-08-14 LAB — TSH: TSH: 0.87 u[IU]/mL (ref 0.35–4.50)

## 2015-08-14 MED ORDER — NORGESTIMATE-ETH ESTRADIOL 0.25-35 MG-MCG PO TABS: 1.0000 | ORAL_TABLET | Freq: Every day | ORAL | Status: DC

## 2015-08-14 NOTE — Assessment & Plan Note (Signed)
Will check cholesterol today.

## 2015-08-14 NOTE — Assessment & Plan Note (Signed)
Reviewed preventive care protocols, scheduled due services, and updated immunizations Discussed nutrition, exercise, diet, and healthy lifestyle.  Influenza vaccine given today.  Screening labs today.  Orders Placed This Encounter  Procedures  . Flu Vaccine QUAD 36+ mos PF IM (Fluarix & Fluzone Quad PF)  . CBC with Differential/Platelet  . Comprehensive metabolic panel  . Lipid panel  . TSH  . HIV antibody (with reflex)

## 2015-08-14 NOTE — Progress Notes (Signed)
Pre visit review using our clinic review tool, if applicable. No additional management support is needed unless otherwise documented below in the visit note. 

## 2015-08-14 NOTE — Patient Instructions (Signed)
Great to see you. We will call you with your results from today and you can view them online. 

## 2015-08-14 NOTE — Progress Notes (Signed)
Patient ID: Allison Carney, female   DOB: 30-Nov-1985, 29 y.o.   MRN: [id number]  Subjective:    Patient ID: Allison Carney, female    DOB: 1985/12/17, 29 y.o.   MRN: [id number]  HPI  29 yo G0  here for CPX.  No h/o abnormal pap smears, sexually active with husband only.  Last pap smear done by me in 07/2014 was normal.  No h/o abnormal pap smears.  Take Sprintec for OCPs, working well and migraines have improved since she started taking.  Does have h/o of elevated LDL.  Lab Results  Component Value Date   CHOL 231* 06/21/2014   HDL 69.70 06/21/2014   LDLCALC 149* 06/21/2014   LDLDIRECT 154.1 04/16/2013   TRIG 62.0 06/21/2014   CHOLHDL 3 06/21/2014     Patient Active Problem List   Diagnosis Date Noted  . Well woman exam with routine gynecological exam 08/12/2014  . Elevated LDL cholesterol level 08/12/2014  . Counseling on health promotion and disease prevention 03/29/2014   Past Medical History  Diagnosis Date  . Migraine    Past Surgical History  Procedure Laterality Date  . Appendectomy     Social History  Substance Use Topics  . Smoking status: Never Smoker   . Smokeless tobacco: None  . Alcohol Use: No   No family history on file. No Known Allergies   Current outpatient prescriptions:  .  fexofenadine-pseudoephedrine (ALLEGRA-D) 60-120 MG per tablet, Take 1 tablet by mouth daily as needed. For allergies, Disp: , Rfl:  .  norgestimate-ethinyl estradiol (SPRINTEC 28) 0.25-35 MG-MCG tablet, Take 1 tablet by mouth daily., Disp: 28 tablet, Rfl: 11 .  SUMAtriptan (IMITREX) 100 MG tablet, Take 1 tablet (100 mg total) by mouth every 2 (two) hours as needed. For migraines, Disp: 10 tablet, Rfl: 1  The PMH, PSH, Social History, Family History, Medications, and allergies have been reviewed in [facility], and have been updated if relevant.   Review of Systems  Constitutional: Negative.   HENT: Negative.   Eyes: Negative.   Respiratory: Negative.   Cardiovascular:  Negative.   Gastrointestinal: Negative.   Endocrine: Negative.   Genitourinary: Negative.   Musculoskeletal: Negative.   Skin: Negative.   Allergic/Immunologic: Negative.   Neurological: Negative.   Hematological: Negative.   Psychiatric/Behavioral: Negative.   All other systems reviewed and are negative.      Objective:   Physical Exam BP 108/62 mmHg  Pulse 66  Temp(Src) 97.9 F (36.6 C) (Oral)  Ht  (1.549 m)  Wt 106 lb (48.081 kg)  BMI 20.04 kg/m2  SpO2 98%  LMP 07/22/2015   General:  Well-developed,well-nourished,in no acute distress; alert,appropriate and cooperative throughout examination Head:  normocephalic and atraumatic.   Eyes:  vision grossly intact, pupils equal, pupils round, and pupils reactive to light.   Ears:  R ear normal and L ear normal.   Nose:  no external deformity.   Mouth:  good dentition.   Neck:  No deformities, masses, or tenderness noted. Breasts:  No mass, nodules, thickening, tenderness, bulging, retraction, inflamation, nipple discharge or skin changes noted.   Lungs:  Normal respiratory effort, chest expands symmetrically. Lungs are clear to auscultation, no crackles or wheezes. Heart:  Normal rate and regular rhythm. S1 and S2 normal without gallop, murmur, click, rub or other extra sounds. Abdomen:  Bowel sounds positive,abdomen soft and non-tender without masses, organomegaly or hernias noted. Msk:  No deformity or scoliosis noted of thoracic or lumbar spine.  Extremities:  No clubbing, cyanosis, edema, or deformity noted with normal full range of motion of all joints.   Neurologic:  alert & oriented X3 and gait normal.   Skin:  Intact without suspicious lesions or rashes Cervical Nodes:  No lymphadenopathy noted Axillary Nodes:  No palpable lymphadenopathy Psych:  Cognition and judgment appear intact. Alert and cooperative with normal attention span and concentration. No apparent delusions, illusions, hallucinations       Assessment & Plan:

## 2016-06-23 ENCOUNTER — Other Ambulatory Visit: Payer: Self-pay | Admitting: Family Medicine

## 2016-07-06 ENCOUNTER — Telehealth: Payer: Self-pay | Admitting: *Deleted

## 2016-07-06 MED ORDER — NORGESTIMATE-ETH ESTRADIOL 0.25-35 MG-MCG PO TABS: 1.0000 | ORAL_TABLET | Freq: Every day | ORAL | 0 refills | Status: DC

## 2016-07-06 NOTE — Telephone Encounter (Signed)
PT requested a refill of her BC through myChart.

## 2016-07-06 NOTE — Telephone Encounter (Signed)
I filled it for a month, but she is needing to schedule a CPE for next month

## 2016-07-08 NOTE — Telephone Encounter (Signed)
Schedule PT for cpe for next month.

## 2016-08-02 ENCOUNTER — Encounter: Payer: BC Managed Care – PPO | Admitting: Family Medicine

## 2016-08-16 ENCOUNTER — Encounter: Payer: Self-pay | Admitting: Family Medicine

## 2016-08-16 ENCOUNTER — Ambulatory Visit (INDEPENDENT_AMBULATORY_CARE_PROVIDER_SITE_OTHER): Payer: BC Managed Care – PPO | Admitting: Family Medicine

## 2016-08-16 ENCOUNTER — Other Ambulatory Visit (HOSPITAL_COMMUNITY)
Admission: RE | Admit: 2016-08-16 | Discharge: 2016-08-16 | Disposition: A | Discharge status: Home / Self Care | Payer: BC Managed Care – PPO | Source: Ambulatory Visit | Attending: Family Medicine | Admitting: Family Medicine

## 2016-08-16 VITALS — BP 96/60 | HR 88 | Temp 97.9°F | Ht 61.25 in | Wt 109.2 lb

## 2016-08-16 DIAGNOSIS — Z01419 Encounter for gynecological examination (general) (routine) without abnormal findings: Secondary | ICD-10-CM

## 2016-08-16 DIAGNOSIS — Z1151 Encounter for screening for human papillomavirus (HPV): Secondary | ICD-10-CM | POA: Diagnosis not present

## 2016-08-16 NOTE — Progress Notes (Signed)
Patient ID: Doy MinceJessica N Gorr, female   DOB: October 17, 1986, 30 y.o.   MRN: [id number]  Subjective:    Patient ID: Doy MinceJessica N Spiller, female    DOB: October 17, 1986, 30 y.o.   MRN: [id number]  HPI  30 yo G0  here for CPX.  No h/o abnormal pap smears, sexually active with husband only.  Last pap smear done by me in 07/2014 was normal.  No h/o abnormal pap smears.  Take Sprintec for OCPs, working well and migraines have improved since she started taking.  Does have h/o of elevated LDL but this was improved last year.  Lab Results  Component Value Date   CHOL 216 (H) 08/14/2015   HDL 60.00 08/14/2015   LDLCALC 130 (H) 08/14/2015   LDLDIRECT 154.1 04/16/2013   TRIG 130.0 08/14/2015   CHOLHDL 4 08/14/2015     Patient Active Problem List  Diagnosis  . Counseling on health promotion and disease prevention  . Well woman exam with routine gynecological exam  . Elevated LDL cholesterol level   Past Medical History:  Diagnosis Date  . Migraine    Past Surgical History:  Procedure Laterality Date  . APPENDECTOMY     Social History  Substance Use Topics  . Smoking status: Never Smoker  . Smokeless tobacco: Not on file  . Alcohol use No   No family history on file. No Known Allergies   Current Outpatient Prescriptions:  .  fexofenadine-pseudoephedrine (ALLEGRA-D) 60-120 MG per tablet, Take 1 tablet by mouth daily as needed. For allergies, Disp: , Rfl:  .  norgestimate-ethinyl estradiol (SPRINTEC 28) 0.25-35 MG-MCG tablet, Take 1 tablet by mouth daily. COMPLETE PHYSICAL EXAM REQUIRED FOR ADDITIONAL REFILLS, Disp: 28 tablet, Rfl: 0 .  SUMAtriptan (IMITREX) 100 MG tablet, Take 1 tablet (100 mg total) by mouth every 2 (two) hours as needed. For migraines, Disp: 10 tablet, Rfl: 1  The PMH, PSH, Social History, Family History, Medications, and allergies have been reviewed in [facility], and have been updated if relevant.   Review of Systems  Constitutional: Negative.   HENT: Negative.   Eyes:  Negative.   Respiratory: Negative.   Cardiovascular: Negative.   Gastrointestinal: Negative.   Endocrine: Negative.   Genitourinary: Negative.   Musculoskeletal: Negative.   Skin: Negative.   Allergic/Immunologic: Negative.   Neurological: Negative.   Hematological: Negative.   Psychiatric/Behavioral: Negative.   All other systems reviewed and are negative.      Objective:   Physical Exam There were no vitals taken for this visit.   General:  Well-developed,well-nourished,in no acute distress; alert,appropriate and cooperative throughout examination Head:  normocephalic and atraumatic.   Eyes:  vision grossly intact, pupils equal, pupils round, and pupils reactive to light.   Ears:  R ear normal and L ear normal.   Nose:  no external deformity.   Mouth:  good dentition.   Neck:  No deformities, masses, or tenderness noted. Breasts:  No mass, nodules, thickening, tenderness, bulging, retraction, inflamation, nipple discharge or skin changes noted.   Lungs:  Normal respiratory effort, chest expands symmetrically. Lungs are clear to auscultation, no crackles or wheezes. Heart:  Normal rate and regular rhythm. S1 and S2 normal without gallop, murmur, click, rub or other extra sounds. Abdomen:  Bowel sounds positive,abdomen soft and non-tender without masses, organomegaly or hernias noted. Rectal:  no external abnormalities.   Genitalia:  Pelvic Exam:        External: normal female genitalia without lesions or masses  Vagina: normal without lesions or masses        Cervix: normal without lesions or masses        Adnexa: normal bimanual exam without masses or fullness        Uterus: normal by palpation        Pap smear: performed Msk:  No deformity or scoliosis noted of thoracic or lumbar spine.   Extremities:  No clubbing, cyanosis, edema, or deformity noted with normal full range of motion of all joints.   Neurologic:  alert & oriented X3 and gait normal.   Skin:  Intact  without suspicious lesions or rashes Cervical Nodes:  No lymphadenopathy noted Axillary Nodes:  No palpable lymphadenopathy Psych:  Cognition and judgment appear intact. Alert and cooperative with normal attention span and concentration. No apparent delusions, illusions, hallucinations      Assessment & Plan:

## 2016-08-16 NOTE — Assessment & Plan Note (Signed)
Reviewed preventive care protocols, scheduled due services, and updated immunizations Discussed nutrition, exercise, diet, and healthy lifestyle.  Pap smear done today.  Labs today.  Orders Placed This Encounter  Procedures  . CBC with Differential/Platelet  . Comprehensive metabolic panel  . Lipid panel  . TSH

## 2016-08-16 NOTE — Addendum Note (Signed)
Addended by: Desmond DikeKNIGHT, Dannelle Rhymes H on: 08/16/2016 03:34 PM   Modules accepted: Orders

## 2016-08-16 NOTE — Progress Notes (Signed)
Pre visit review using our clinic review tool, if applicable. No additional management support is needed unless otherwise documented below in the visit note. 

## 2016-08-17 ENCOUNTER — Other Ambulatory Visit: Payer: Self-pay | Admitting: Family Medicine

## 2016-08-17 LAB — COMPREHENSIVE METABOLIC PANEL
ALBUMIN: 4 g/dL (ref 3.5–5.2)
ALT: 5 U/L (ref 0–35)
AST: 14 U/L (ref 0–37)
Alkaline Phosphatase: 35 U/L — ABNORMAL LOW (ref 39–117)
BILIRUBIN TOTAL: 0.2 mg/dL (ref 0.2–1.2)
BUN: 11 mg/dL (ref 6–23)
CALCIUM: 9.2 mg/dL (ref 8.4–10.5)
CHLORIDE: 105 meq/L (ref 96–112)
CO2: 25 mEq/L (ref 19–32)
CREATININE: 0.66 mg/dL (ref 0.40–1.20)
GFR: 112.32 mL/min (ref >60.00)
Glucose, Bld: 95 mg/dL (ref 70–99)
Potassium: 3.7 mEq/L (ref 3.5–5.1)
Sodium: 140 mEq/L (ref 135–145)
TOTAL PROTEIN: 7 g/dL (ref 6.0–8.3)

## 2016-08-17 LAB — CBC WITH DIFFERENTIAL/PLATELET
BASOS ABS: 0 10*3/uL (ref 0.0–0.1)
BASOS PCT: 0.7 % (ref 0.0–3.0)
EOS ABS: 0 10*3/uL (ref 0.0–0.7)
Eosinophils Relative: 0.8 % (ref 0.0–5.0)
HEMATOCRIT: 38.4 % (ref 36.0–46.0)
HEMOGLOBIN: 13 g/dL (ref 12.0–15.0)
LYMPHS PCT: 26.5 % (ref 12.0–46.0)
Lymphs Abs: 1.6 10*3/uL (ref 0.7–4.0)
MCHC: 33.9 g/dL (ref 30.0–36.0)
MCV: 84.6 fl (ref 78.0–100.0)
MONO ABS: 0.4 10*3/uL (ref 0.1–1.0)
Monocytes Relative: 7.3 % (ref 3.0–12.0)
Neutro Abs: 3.8 10*3/uL (ref 1.4–7.7)
Neutrophils Relative %: 64.7 % (ref 43.0–77.0)
Platelets: 255 10*3/uL (ref 150.0–400.0)
RBC: 4.54 Mil/uL (ref 3.87–5.11)
RDW: 12.9 % (ref 11.5–15.5)
WBC: 5.9 10*3/uL (ref 4.0–10.5)

## 2016-08-17 LAB — LIPID PANEL
CHOLESTEROL: 212 mg/dL — AB (ref 0–200)
HDL: 69.1 mg/dL (ref >39.00)
LDL CALC: 124 mg/dL — AB (ref 0–99)
NonHDL: 142.77
TRIGLYCERIDES: 94 mg/dL (ref 0.0–149.0)
Total CHOL/HDL Ratio: 3
VLDL: 18.8 mg/dL (ref 0.0–40.0)

## 2016-08-17 LAB — TSH: TSH: 1.07 u[IU]/mL (ref 0.35–4.50)

## 2016-08-18 LAB — CYTOLOGY - PAP
ADEQUACY: ABSENT
DIAGNOSIS: NEGATIVE
HPV: NOT DETECTED

## 2016-08-19 ENCOUNTER — Encounter: Payer: Self-pay | Admitting: *Deleted

## 2016-08-22 ENCOUNTER — Other Ambulatory Visit: Payer: Self-pay

## 2016-10-05 ENCOUNTER — Ambulatory Visit (INDEPENDENT_AMBULATORY_CARE_PROVIDER_SITE_OTHER): Payer: BC Managed Care – PPO | Admitting: Internal Medicine

## 2016-10-05 ENCOUNTER — Encounter: Payer: Self-pay | Admitting: Internal Medicine

## 2016-10-05 VITALS — BP 108/70 | HR 85 | Temp 98.5°F | Wt 111.0 lb

## 2016-10-05 DIAGNOSIS — J019 Acute sinusitis, unspecified: Secondary | ICD-10-CM | POA: Diagnosis not present

## 2016-10-05 DIAGNOSIS — B9689 Other specified bacterial agents as the cause of diseases classified elsewhere: Secondary | ICD-10-CM | POA: Diagnosis not present

## 2016-10-05 MED ORDER — AMOXICILLIN 875 MG PO TABS: 875.0000 mg | ORAL_TABLET | Freq: Two times a day (BID) | ORAL | 0 refills | Status: DC

## 2016-10-05 NOTE — Patient Instructions (Signed)

## 2016-10-05 NOTE — Progress Notes (Signed)
HPI  Pt presents to the clinic today with c/o headache, nasal congestion and cough. This has been intermittent over the last month, but she reports her symptoms got really bad over the last week. She is blowing yellow mucous out of her nose. The cough is productive of yellow mucous. She denies chest pain or shortness of breath. She reports she has run low grade fevers, had chills and body aches. She has tried Mucinex and Tylenol cold and sinus with minimal relief. She has no history of seasonal allergies. She has had sick contacts.  Review of Systems     Past Medical History:  Diagnosis Date  . Migraine     No family history on file.  Social History   Social History  . Marital status: Single    Spouse name: N/A  . Number of children: N/A  . Years of education: N/A   Occupational History  . Not on file.   Social History Main Topics  . Smoking status: Never Smoker  . Smokeless tobacco: Not on file  . Alcohol use No  . Drug use: No  . Sexual activity: Not on file   Other Topics Concern  . 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    No Known Allergies   Constitutional: Positive headache, fatigue and fever. Denies abrupt weight changes.  HEENT:  Positive nasal congestion. Denies eye redness, ear pain, ringing in the ears, wax buildup, runny nose or sore throat. Respiratory: Positive cough. Denies difficulty breathing or shortness of breath.  Cardiovascular: Denies chest pain, chest tightness, palpitations or swelling in the hands or feet.   No other specific complaints in a complete review of systems (except as listed in HPI above).  Objective:   BP 108/70   Pulse 85   Temp 98.5 F (36.9 C) (Oral)   Wt 111 lb (50.3 kg)   LMP 09/14/2016   SpO2 99%   BMI 20.80 kg/m   General: Appears her stated age, ill appearing, in NAD. HEENT: Head: normal shape and size, maxillary sinus tenderness noted; Eyes:  sclera white, no icterus, conjunctiva pink; Ears: Tm's gray and intact, normal light reflex; Nose: mucosa boggy and moist, septum midline; Throat/Mouth: + PND. Teeth present, mucosa erythematous and moist, no exudate noted, no lesions or ulcerations noted.  Neck:  No adenopathy noted.  Cardiovascular: Normal rate and rhythm. S1,S2 noted.  No murmur, rubs or gallops noted.  Pulmonary/Chest: Normal effort and positive vesicular breath sounds. No respiratory distress. No wheezes, rales or ronchi noted.       Assessment & Plan:   Acute bacterial sinusitis  Can use a Neti Pot which can be purchased from your local drug store. Flonase 2 sprays each nostril for 3 days and then as needed. eRx for Augmentin BID for 10 days  RTC as needed or if symptoms persist. Nicki ReaperBAITY, Jehan Bonano, NP

## 2016-10-26 ENCOUNTER — Encounter: Payer: Self-pay | Admitting: Family Medicine

## 2016-10-28 ENCOUNTER — Encounter: Payer: Self-pay | Admitting: Family Medicine

## 2016-10-28 ENCOUNTER — Ambulatory Visit (INDEPENDENT_AMBULATORY_CARE_PROVIDER_SITE_OTHER): Payer: BC Managed Care – PPO | Admitting: Family Medicine

## 2016-10-28 DIAGNOSIS — R0981 Nasal congestion: Secondary | ICD-10-CM | POA: Diagnosis not present

## 2016-10-28 MED ORDER — LEVOCETIRIZINE DIHYDROCHLORIDE 5 MG PO TABS: 5.0000 mg | ORAL_TABLET | Freq: Every evening | ORAL | 3 refills | Status: DC

## 2016-10-28 MED ORDER — SUMATRIPTAN SUCCINATE 100 MG PO TABS: 100.0000 mg | ORAL_TABLET | ORAL | 1 refills | Status: DC | PRN

## 2016-10-28 NOTE — Patient Instructions (Signed)
Great to see you.  Let's stop Allegra.  Try Xyzal at bedtime, daily.  We are referring you to an allergist.

## 2016-10-28 NOTE — Progress Notes (Signed)
Pre visit review using our clinic review tool, if applicable. No additional management support is needed unless otherwise documented below in the visit note. 

## 2016-10-28 NOTE — Progress Notes (Addendum)
SUBJECTIVE:  Allison Carney is a 31 y.o. female who complains of persistent congestion.  Saw Allison Carney on 10/05/16.  Note reviewed. Was advised allegra and nasal spray for symptoms along with Augmentin twice daily for 10 days for acute bacterial sinusitis.  Symptoms returned until two days ago.  At that time, she sent me a mychart message saying that she is again congested now that she returned to work.  ? Allergic trigger at work?  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  Had allergy shots as a teenager.  Currently taking Allegra and flonase without relief of symptoms.  Current Outpatient Prescriptions on File Prior to Visit  Medication Sig Dispense Refill  . fexofenadine-pseudoephedrine (ALLEGRA-D) 60-120 MG per tablet Take 1 tablet by mouth daily as needed. For allergies    . norgestimate-ethinyl estradiol (SPRINTEC 28) 0.25-35 MG-MCG tablet Take 1 tablet by mouth daily. 3 Package 3   No current facility-administered medications on file prior to visit.     No Known Allergies  Past Medical History:  Diagnosis Date  . Migraine     Past Surgical History:  Procedure Laterality Date  . APPENDECTOMY      No family history on file.  Social History   Social History  . Marital status: Single    Spouse name: N/A  . Number of children: N/A  . Years of education: N/A   Occupational History  . Not on file.   Social History Main Topics  . Smoking status: Never Smoker  . Smokeless tobacco: Not on file  . Alcohol use No  . Drug use: No  . Sexual activity: Not on file   Other Topics Concern  . 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   The PMH, PSH, Social History, Family History, Medications, and allergies [facility], and have been updated if relevant.  OBJECTIVE: BP 96/66   Pulse 100   Temp 97.9 F (36.6 C) (Oral)   Wt 110 lb 8 oz (50.1 kg)   LMP 10/13/2016   SpO2 98%    BMI 20.71 kg/m   She appears well, vital signs are as noted. Ears normal.  Throat and pharynx normal.  Neck supple. No adenopathy in the neck. Nose is congested. Sinuses non tender. The chest is clear, without wheezes or rales.  ASSESSMENT:  allergic rhinitis  PLAN: D/c allegra Start Xyzal.  Continue flonase.  Symptomatic therapy suggested: push fluids, rest and return office visit prn if symptoms persist or worsen. Call or return to clinic prn if these symptoms worsen or fail to improve as anticipated.

## 2017-01-05 ENCOUNTER — Other Ambulatory Visit: Payer: Self-pay | Admitting: Family Medicine

## 2017-01-05 MED ORDER — LEVOCETIRIZINE DIHYDROCHLORIDE 5 MG PO TABS: 5.0000 mg | ORAL_TABLET | Freq: Every evening | ORAL | 3 refills | Status: DC

## 2017-03-17 ENCOUNTER — Other Ambulatory Visit: Payer: Self-pay | Admitting: Family Medicine

## 2017-03-18 NOTE — Telephone Encounter (Signed)
Last refill 10/28/16 #10+1,last OV 08/16/16

## 2017-04-13 ENCOUNTER — Other Ambulatory Visit: Payer: Self-pay | Admitting: Family Medicine

## 2017-07-02 ENCOUNTER — Other Ambulatory Visit: Payer: Self-pay | Admitting: Family Medicine

## 2017-08-03 ENCOUNTER — Encounter: Payer: Self-pay | Admitting: Family Medicine

## 2017-08-03 ENCOUNTER — Ambulatory Visit (INDEPENDENT_AMBULATORY_CARE_PROVIDER_SITE_OTHER): Payer: BC Managed Care – PPO | Admitting: Family Medicine

## 2017-08-03 VITALS — BP 110/72 | HR 84 | Wt 111.0 lb

## 2017-08-03 DIAGNOSIS — Z7689 Persons encountering health services in other specified circumstances: Secondary | ICD-10-CM

## 2017-08-03 DIAGNOSIS — N921 Excessive and frequent menstruation with irregular cycle: Secondary | ICD-10-CM

## 2017-08-03 NOTE — Patient Instructions (Signed)
It was a pleasure to meet you today! I look forward to partnering with you for your health care needs  If you have continued spotting, please let me know and we can adjust your pills.

## 2017-08-03 NOTE — Progress Notes (Signed)
   Subjective:    Patient ID: Allison Carney, female    DOB: May 26, 1986, 31 y.o.   MRN: [id number]  HPI This is a 31 yo female who presents today to establish care, she is transferring from Dr. Dayton MartesAron. 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 She enjoys exercise, is training for a half marathon.   Her only concern today is vaginal spotting between periods. Was in July for the month and had spotting the entire month. This improved when she got home, but has had two instances of breakthrough bleeding the weak before. Not heavy, brownish red, just wears a panty liner, lasts 1-3 days. No increased stress, no weight loss, has been exercising more (running, training for a half marathon). Never misses pills, takes within 30 minutes of same time every day.  No abdominal pain, cramping.   Past Medical History:  Diagnosis Date  . Migraine    Past Surgical History:  Procedure Laterality Date  . APPENDECTOMY     No family history on file. Social History  Substance Use Topics  . Smoking status: Never Smoker  . Smokeless tobacco: Not on file  . Alcohol use No      Review of Systems Per HPI    Objective:   Physical Exam Physical Exam  Vitals reviewed. Constitutional: Oriented to person, place, and time. Appears well-developed and well-nourished.  HENT:  Head: Normocephalic and atraumatic.  Eyes: Conjunctivae are normal.  Neck: Normal range of motion. Neck supple.  Cardiovascular: Normal rate.   Pulmonary/Chest: Effort normal.  Musculoskeletal: Normal range of motion.  Neurological: Alert and oriented to person, place, and time.  Skin: Skin is warm and dry.  Psychiatric: Normal mood and affect. Behavior is normal. Judgment and thought content normal.    BP 110/72 (BP Location: Left Arm, Patient Position: Sitting, Cuff Size: Normal)   Pulse 84   Wt 111 lb (50.3 kg)   LMP 07/12/2017   SpO2 99%   BMI 20.80 kg/m  Wt Readings from Last 3 Encounters:  08/03/17 111 lb (50.3 kg)    10/28/16 110 lb 8 oz (50.1 kg)  10/05/16 111 lb (50.3 kg)       Assessment & Plan:  1. Encounter to establish care - Discussed and encouraged healthy lifestyle choices- adequate sleep, regular exercise, stress management and healthy food choices.   2. Breakthrough bleeding on birth control pills - if continues, she will let me know and we can try different OCP   Olean Reeeborah Gessner, FNP-BC  [facility], [facility]  08/04/2017 4:52 PM

## 2017-08-04 ENCOUNTER — Encounter: Payer: Self-pay | Admitting: Family Medicine

## 2017-08-17 ENCOUNTER — Ambulatory Visit (INDEPENDENT_AMBULATORY_CARE_PROVIDER_SITE_OTHER): Payer: BC Managed Care – PPO | Admitting: Family Medicine

## 2017-08-17 ENCOUNTER — Encounter: Payer: Self-pay | Admitting: Family Medicine

## 2017-08-17 ENCOUNTER — Encounter: Payer: BC Managed Care – PPO | Admitting: Family Medicine

## 2017-08-17 VITALS — BP 108/78 | HR 75 | Temp 98.0°F | Ht 61.25 in | Wt 109.8 lb

## 2017-08-17 DIAGNOSIS — Z0000 Encounter for general adult medical examination without abnormal findings: Secondary | ICD-10-CM | POA: Diagnosis not present

## 2017-08-17 DIAGNOSIS — N939 Abnormal uterine and vaginal bleeding, unspecified: Secondary | ICD-10-CM | POA: Diagnosis not present

## 2017-08-17 DIAGNOSIS — N949 Unspecified condition associated with female genital organs and menstrual cycle: Secondary | ICD-10-CM | POA: Diagnosis not present

## 2017-08-17 DIAGNOSIS — B079 Viral wart, unspecified: Secondary | ICD-10-CM

## 2017-08-17 NOTE — Patient Instructions (Signed)
Can try Wart tape  If you need additional freezing, please make an appointment  Warts Warts are small growths on the skin. They are common and can occur on various areas of the body. A person may have one wart or multiple warts. Most warts are not painful, and they usually do not cause problems. However, warts can cause pain if they are large or occur in an area of the body where pressure will be applied to them, such as the bottom of the foot. In many cases, warts do not require treatment. They usually go away on their own over a period of many months to a couple years. Various treatments may be done for warts that cause problems or do not go away. Sometimes, warts go away and then come back again. What are the causes? Warts are caused by a type of virus that is called human papillomavirus (HPV). This virus can spread from person to person through direct contact. Warts can also spread to other areas of the body when a person scratches a wart and then scratches another area of his or her body. What increases the risk? Warts are more likely to develop in:  People who are 3410-30 years of age.  People who have a weakened body defense system (immune system).  What are the signs or symptoms? A wart may be round or oval or have an irregular shape. Most warts have a rough surface. Warts may range in color from skin color to light yellow, brown, or gray. They are generally less than  inch (1.3 cm) in size. Most warts are painless, but some can be painful when pressure is applied to them. How is this diagnosed? A wart can usually be diagnosed from its appearance. In some cases, a tissue sample may be removed (biopsy) to be looked at under a microscope. How is this treated? In many cases, warts do not need treatment. If treatment is needed, options may include:  Applying medicated solutions, creams, or patches to the wart. These may be over-the-counter or prescription medicines that make the skin soft  so that layers will gradually shed away. In many cases, the medicine is applied one or two times per day and covered with a bandage.  Putting duct tape over the top of the wart (occlusion). You will leave the tape in place for as long as told by your health care provider, then you will replace it with a new strip of tape. This is done until the wart goes away.  Freezing the wart with liquid nitrogen (cryotherapy).  Burning the wart with: ? Laser treatment. ? An electrified probe (electrocautery).  Injection of a medicine (Candida antigen) into the wart to help the body's immune system to fight off the wart.  Surgery to remove the wart.  Follow these instructions at home:  Apply over-the-counter and prescription medicines only as told by your health care provider.  Do not apply over-the-counter wart medicines to your face or genitals before you ask your health care provider if it is okay to do so.  Do not scratch or pick at a wart.  Wash your hands after you touch a wart.  Avoid shaving hair that is over a wart.  Keep all follow-up visits as told by your health care provider. This is important. Contact a health care provider if:  Your warts do not improve after treatment.  You have redness, swelling, or pain at the site of a wart.  You have bleeding from a wart  that does not stop with light pressure.  You have diabetes and you develop a wart. This information is not intended to replace advice given to you by your health care provider. Make sure you discuss any questions you have with your health care provider. Document Released: 07/14/2005 Document Revised: 03/17/2016 Document Reviewed: 12/30/2014 Elsevier Interactive Patient Education  [employer].

## 2017-08-17 NOTE — Progress Notes (Signed)
   Subjective:    Patient ID: Allison Carney, female    DOB: 18-Sep-1986, 31 y.o.   MRN: [id number]  HPI    Review of Systems     Objective:   Physical Exam        Assessment & Plan:

## 2017-08-17 NOTE — Progress Notes (Signed)
   Subjective:    Patient ID: Allison Carney, female    DOB: 12-24-1985, 31 y.o.   MRN: [id number]  HPI This is a 31 yo female who presents today for CPE.   Last CPE- 10/17 Pap- 10/17- negative HPV; sexually active with husband only Tdap- 03/29/14 Flu- annual Eye- annual Dental- bi-annual Exercise- most days Diet- eats variety of fruits and vegetables, eats meat and dairy  She has had further breakthrough bleeding. No itching or odor. Has been taking OCP every day. Had one episode of burning with intercourse in last week.   Has wart on   Past Medical History:  Diagnosis Date  . Migraine    Past Surgical History:  Procedure Laterality Date  . APPENDECTOMY     No family history on file. Social History  Substance Use Topics  . Smoking status: Never Smoker  . Smokeless tobacco: Never Used  . Alcohol use No       Review of Systems  Constitutional: Negative for fatigue.  HENT: Positive for congestion (chronic, sees allergist).   Eyes: Negative.   Genitourinary: Positive for dyspareunia (burning x 1) and menstrual problem (break through bleeding). Negative for dysuria, frequency, genital sores and hematuria.  Musculoskeletal: Negative.   Psychiatric/Behavioral: Negative for dysphoric mood and sleep disturbance. The patient is not nervous/anxious.        Objective:   Physical Exam  Constitutional: She is oriented to person, place, and time. She appears well-developed and well-nourished. No distress.  HENT:  Head: Normocephalic and atraumatic.  Mouth/Throat: Oropharynx is clear and moist.  Eyes: Conjunctivae are normal.  Neck: Normal range of motion. Neck supple.  Cardiovascular: Normal rate, regular rhythm and normal heart sounds.   Pulmonary/Chest: Effort normal and breath sounds normal.  Genitourinary: Pelvic exam was performed with patient supine. There is no rash, tenderness, lesion or injury on the right labia. There is no rash, tenderness, lesion or injury on  the left labia.  Musculoskeletal: Normal range of motion.  Neurological: She is alert and oriented to person, place, and time.  Skin: Skin is warm and dry. She is not diaphoretic.  Finger with wart, treated with liquid nitrogen freeze/thaw x 3.   Psychiatric: She has a normal mood and affect. Her behavior is normal. Judgment and thought content normal.  Vitals reviewed.     BP 108/78 (BP Location: Right Arm, Patient Position: Sitting, Cuff Size: Normal)   Pulse 75   Temp 98 F (36.7 C) (Oral)   Ht 5' 1.25" (1.556 m)   Wt 109 lb 12 oz (49.8 kg)   LMP 08/10/2017   SpO2 99%   BMI 20.57 kg/m  Wt Readings from Last 3 Encounters:  08/17/17 109 lb 12 oz (49.8 kg)  08/03/17 111 lb (50.3 kg)  10/28/16 110 lb 8 oz (50.1 kg)   Depression screen PHQ 2/9 08/14/2015  Decreased Interest 0  Down, Depressed, Hopeless 0  PHQ - 2 Score 0        Assessment & Plan:  1. Annual physical exam - Discussed and encouraged healthy lifestyle choices- adequate sleep, regular exercise, stress management and healthy food choices.   2. Vaginal spotting - CBC - TSH  3. Vaginal burning - WET PREP BY MOLECULAR PROBE  4. Viral wart on finger - cryotherapy - Provided written and verbal information regarding diagnosis and treatment.  Allison Reeeborah Irene Collings, FNP-BC   [facility]  08/20/2017 7:03 PM

## 2017-08-18 LAB — CBC
HEMATOCRIT: 37.8 % (ref 36.0–46.0)
HEMOGLOBIN: 12.4 g/dL (ref 12.0–15.0)
MCHC: 32.8 g/dL (ref 30.0–36.0)
MCV: 87.6 fl (ref 78.0–100.0)
PLATELETS: 247 10*3/uL (ref 150.0–400.0)
RBC: 4.31 Mil/uL (ref 3.87–5.11)
RDW: 13.2 % (ref 11.5–15.5)
WBC: 4.6 10*3/uL (ref 4.0–10.5)

## 2017-08-18 LAB — WET PREP BY MOLECULAR PROBE
CANDIDA SPECIES: NOT DETECTED
MICRO NUMBER:: 81221889
SPECIMEN QUALITY: ADEQUATE
TRICHOMONAS VAG: NOT DETECTED

## 2017-08-18 LAB — TSH: TSH: 0.97 u[IU]/mL (ref 0.35–4.50)

## 2017-08-19 MED ORDER — METRONIDAZOLE 500 MG PO TABS: 500.0000 mg | ORAL_TABLET | Freq: Two times a day (BID) | ORAL | 0 refills | Status: DC

## 2017-09-11 ENCOUNTER — Encounter: Payer: Self-pay | Admitting: Family Medicine

## 2017-09-16 ENCOUNTER — Other Ambulatory Visit: Payer: Self-pay | Admitting: Family Medicine

## 2017-09-16 DIAGNOSIS — S01319A Laceration without foreign body of unspecified ear, initial encounter: Secondary | ICD-10-CM

## 2017-10-01 ENCOUNTER — Other Ambulatory Visit: Payer: Self-pay | Admitting: Family Medicine

## 2017-10-03 NOTE — Telephone Encounter (Signed)
DG-Plz see refill req/thx dmf 

## 2017-10-21 ENCOUNTER — Other Ambulatory Visit: Payer: Self-pay

## 2017-10-21 ENCOUNTER — Encounter: Payer: Self-pay | Admitting: *Deleted

## 2017-10-24 ENCOUNTER — Encounter: Payer: Self-pay | Admitting: Anesthesiology

## 2017-10-28 ENCOUNTER — Encounter
Admission: RE | Disposition: A | Discharge status: Home / Self Care | Payer: Self-pay | Source: Ambulatory Visit | Attending: Unknown Physician Specialty

## 2017-10-28 ENCOUNTER — Ambulatory Visit
Admission: RE | Admit: 2017-10-28 | Discharge: 2017-10-28 | Disposition: A | Discharge status: Home / Self Care | Payer: BC Managed Care – PPO | Source: Ambulatory Visit | Attending: Unknown Physician Specialty | Admitting: Unknown Physician Specialty

## 2017-10-28 DIAGNOSIS — X58XXXA Exposure to other specified factors, initial encounter: Secondary | ICD-10-CM | POA: Diagnosis not present

## 2017-10-28 DIAGNOSIS — S01312A Laceration without foreign body of left ear, initial encounter: Secondary | ICD-10-CM | POA: Diagnosis not present

## 2017-10-28 HISTORY — PX: FACIAL LACERATION REPAIR: SHX6589

## 2017-10-28 HISTORY — DX: Presence of spectacles and contact lenses: Z97.3

## 2017-10-28 SURGERY — REPAIR, LACERATION, FACE
Anesthesia: General | Site: Ear | Laterality: Left | Wound class: Clean

## 2017-10-28 MED ORDER — BACITRACIN 500 UNIT/GM EX OINT
TOPICAL_OINTMENT | CUTANEOUS | Status: DC | PRN
  Administered 2017-10-28: 1 via TOPICAL

## 2017-10-28 MED ORDER — LIDOCAINE-EPINEPHRINE 1 %-1:100000 IJ SOLN
INTRAMUSCULAR | Status: DC | PRN
  Administered 2017-10-28: .5 mL

## 2017-10-28 MED ORDER — LACTATED RINGERS IV SOLN: 10.0000 mL/h | INTRAVENOUS | Status: DC

## 2017-10-28 SURGICAL SUPPLY — 13 items
BLADE SURG 15 STRL LF DISP TIS (BLADE) ×1 IMPLANT
BLADE SURG 15 STRL SS (BLADE) ×1
CANISTER SUCT 1200ML W/VALVE (MISCELLANEOUS) ×2 IMPLANT
GLOVE BIO SURGEON STRL SZ7.5 (GLOVE) ×2 IMPLANT
GOWN STRL REUS W/ TWL LRG LVL3 (GOWN DISPOSABLE) ×2 IMPLANT
GOWN STRL REUS W/TWL LRG LVL3 (GOWN DISPOSABLE) ×2
KIT ROOM TURNOVER OR (KITS) ×2 IMPLANT
NS IRRIG 500ML POUR BTL (IV SOLUTION) ×2 IMPLANT
PACK HEAD/NECK (MISCELLANEOUS) ×2 IMPLANT
SUCTION FRAZIER TIP 10 FR DISP (SUCTIONS) ×2 IMPLANT
SUT PROLENE 5 0 P 3 (SUTURE) ×2 IMPLANT
SUT VIC AB 5-0 P-3 18X BRD (SUTURE) ×1 IMPLANT
SUT VIC AB 5-0 P3 18 (SUTURE) ×1

## 2017-10-28 NOTE — H&P (Signed)
The patient's history has been reviewed, patient examined, no change in status, stable for surgery.  Questions were answered to the patients satisfaction.  

## 2017-10-28 NOTE — OR Nursing (Signed)
Vital Signs  10:55am  BP 128/83 O2 Sat 100   11:00am  BP 125/72 O2 Sat 100   11:05am  BP 117/69 O2 Sat 100   11:08  BP 126/75 O2 Sat 100

## 2017-10-28 NOTE — Progress Notes (Signed)
Patient is not getting anesthesia for ear lobe repair and therefore NPO status was not assessed.

## 2017-10-28 NOTE — Op Note (Signed)
10/28/2017  11:08 AM    Allison Carney, Allison Carney  [id number]   Pre-Op Dx: TRAUMATIC EAR LOBE LACERATION  Post-op Dx: SAME  Proc: Repair traumatic earlobe laceration   Surg:  Davina Pokehapman T Wisdom Rickey  Anes:  GOT  EBL:  Less than 5 cc  Comp:  None  Findings:  Complete tear through earlobe  Procedure: Just was identified in the holding area taken the operating room placed in supine position. With the left ear prepped and draped sterilely a local anesthetic of 1% lidocaine with 1 100,000 units of epinephrine was used to inject the earlobe. A total of 1 cc was used. There was complete tear in the earlobe creating a V shaped defect in the lobe. With the ear prepped sterilely an 11 blade was used to excise the epithelial tract with in the V. The deep tissues of the earlobe were closed using a 5-0 Vicryl and the skin edges were reapproximated using interrupted 5-0 Prolene. This gave excellent closure to the laceration. The patient was then taken to the recovery room in stable condition.  Dispo:   Good  Plan:  Discharge to home follow-up 7-10 days for suture removal.  Davina PokeChapman T Mavery Milling  10/28/2017 11:08 AM

## 2017-10-31 ENCOUNTER — Encounter: Payer: Self-pay | Admitting: Unknown Physician Specialty

## 2018-02-22 ENCOUNTER — Other Ambulatory Visit: Payer: Self-pay | Admitting: Family Medicine

## 2018-02-23 MED ORDER — SUMATRIPTAN SUCCINATE 100 MG PO TABS: 100.0000 mg | ORAL_TABLET | ORAL | 0 refills | Status: DC | PRN

## 2018-02-23 NOTE — Telephone Encounter (Signed)
Ok to refill? Electronically refill request. Last prescribed by Dr Dayton Martes on 03/18/2017. Est care with Debbie on 08/03/2017.

## 2018-06-06 ENCOUNTER — Encounter: Payer: Self-pay | Admitting: Family Medicine

## 2018-08-06 ENCOUNTER — Encounter: Payer: Self-pay | Admitting: Family Medicine

## 2018-08-28 ENCOUNTER — Encounter: Payer: Self-pay | Admitting: Family Medicine

## 2018-08-28 ENCOUNTER — Ambulatory Visit (INDEPENDENT_AMBULATORY_CARE_PROVIDER_SITE_OTHER): Payer: BC Managed Care – PPO | Admitting: Family Medicine

## 2018-08-28 VITALS — BP 106/78 | HR 76 | Temp 97.9°F | Ht 61.25 in | Wt 110.1 lb

## 2018-08-28 DIAGNOSIS — Z0000 Encounter for general adult medical examination without abnormal findings: Secondary | ICD-10-CM | POA: Diagnosis not present

## 2018-08-28 DIAGNOSIS — G43709 Chronic migraine without aura, not intractable, without status migrainosus: Secondary | ICD-10-CM | POA: Diagnosis not present

## 2018-08-28 MED ORDER — SUMATRIPTAN SUCCINATE 100 MG PO TABS: 100.0000 mg | ORAL_TABLET | ORAL | 0 refills | Status: DC | PRN

## 2018-08-28 NOTE — Progress Notes (Signed)
Subjective:    Patient ID: Allison Carney, female    DOB: 10-Mar-1986, 32 y.o.   MRN: [id number]  HPI This is a 32 yo female who presents today for CPE.   Last CPE- last year Pap-  2017, sees gyn Tdap- 03/29/2014 Flu- annual, has had  Eye- regular Dental- regular Exercise- runs and goes to gym regularly Sleep- 7-8 hours nightly, good energy level Stress- low  Migraines- have improved since coming off OCPs.    Past Medical History:  Diagnosis Date  . Migraine    approx 1x/month  . Wears contact lenses    Past Surgical History:  Procedure Laterality Date  . APPENDECTOMY    . FACIAL LACERATION REPAIR Left 10/28/2017   Procedure: LEFT EAR LOBE LACERATION REPAIR;  Surgeon: Linus Salmons, MD;  [facility];  Service: ENT;  Laterality: Left;  LOCAL   No family history on file. Social History   Tobacco Use  . Smoking status: Never Smoker  . Smokeless tobacco: Never Used  Substance Use Topics  . Alcohol use: No    Comment: rare  . Drug use: No      Review of Systems  Constitutional: Negative.   HENT: Positive for rhinorrhea. Negative for postnasal drip.   Eyes: Negative.   Respiratory: Negative.   Cardiovascular: Negative.   Gastrointestinal: Negative.   Endocrine: Negative.   Genitourinary: Negative.   Musculoskeletal: Negative.   Allergic/Immunologic: Positive for environmental allergies.  Neurological: Negative.   Hematological: Negative.   Psychiatric/Behavioral: Negative.        Objective:   Physical Exam Physical Exam  Constitutional: She is oriented to person, place, and time. She appears well-developed and well-nourished. No distress.  HENT:  Head: Normocephalic and atraumatic.  Right Ear: External ear normal.  Left Ear: External ear normal.  Nose: Nose normal.  Mouth/Throat: Oropharynx is clear and moist. No oropharyngeal exudate.  Eyes: Conjunctivae are normal. Pupils are equal, round, and reactive to light.  Neck:  Normal range of motion. Neck supple. No JVD present. No thyromegaly present.  Cardiovascular: Normal rate, regular rhythm, normal heart sounds and intact distal pulses.   Pulmonary/Chest: Effort normal and breath sounds normal. Right breast exhibits no inverted nipple, no mass, no nipple discharge, no skin change and no tenderness. Left breast exhibits no inverted nipple, no mass, no nipple discharge, no skin change and no tenderness. Breasts are symmetrical.  Abdominal: Soft. Bowel sounds are normal. She exhibits no distension and no mass. There is no tenderness. There is no rebound and no guarding.  Musculoskeletal: Normal range of motion. She exhibits no edema or tenderness.  Lymphadenopathy:    She has no cervical adenopathy.  Neurological: She is alert and oriented to person, place, and time. She has normal reflexes.  Skin: Skin is warm and dry. She is not diaphoretic.  Psychiatric: She has a normal mood and affect. Her behavior is normal. Judgment and thought content normal.  Vitals reviewed.  BP 106/78 (BP Location: Right Arm, Patient Position: Sitting, Cuff Size: Normal)   Pulse 76   Temp 97.9 F (36.6 C) (Oral)   Ht 5' 1.25" (1.556 m)   Wt 110 lb 1.9 oz (50 kg)   LMP 08/04/2018   SpO2 99%   BMI 20.64 kg/m  Wt Readings from Last 3 Encounters:  08/28/18 110 lb 1.9 oz (50 kg)  10/21/17 109 lb 12 oz (49.8 kg)  08/17/17 109 lb 12 oz (49.8 kg)   Depression screen [facility] 2/9 08/28/2018  08/14/2015  Decreased Interest 0 0  Down, Depressed, Hopeless 0 0  PHQ - 2 Score 0 0       Assessment & Plan:  1. Annual physical exam - Discussed and encouraged healthy lifestyle choices- adequate sleep, regular exercise, stress management and healthy food choices.  - RTC 1 year for CPE  2. Chronic migraine without aura without status migrainosus, not intractable - SUMAtriptan (IMITREX) 100 MG tablet; Take 1 tablet (100 mg total) by mouth every 2 (two) hours as needed. For migraines  Dispense:  10 tablet; Refill: 0   Olean Ree, FNP-BC  [place] [facility], [facility]  08/29/2018 8:43 AM

## 2018-08-28 NOTE — Patient Instructions (Addendum)
Great to see you today  No labs needed  Follow up in 1 years, sooner if any concerns  Keeping You Healthy  Get These Tests 1. Blood Pressure- Have your blood pressure checked once a year by your health care provider.  Normal blood pressure is 120/80. 2. Weight- Have your body mass index (BMI) calculated to screen for obesity.  BMI is measure of body fat based on height and weight.  You can also calculate your own BMI at https://www.west-esparza.com/. 3. Cholesterol- Have your cholesterol checked every 5 years starting at age 74 then yearly starting at age 71. 4. Chlamydia, HIV, and other sexually transmitted diseases- Get screened every year until age 24, then within three months of each new sexual provider. 5. Pap Test - Every 1-5 years; discuss with your health care provider. 6. Mammogram- Every 1-2 years starting at age 62--50  Take these medicines  Calcium with Vitamin D-Your body needs 1200 mg of Calcium each day and [phone] IU of Vitamin D daily.  Your body can only absorb 500 mg of Calcium at a time so Calcium must be taken in 2 or 3 divided doses throughout the day.  Multivitamin with folic acid- Once daily if it is possible for you to become pregnant.  Get these Immunizations  Gardasil-Series of three doses; prevents HPV related illness such as genital warts and cervical cancer.  Menactra-Single dose; prevents meningitis.  Tetanus shot- Every 10 years.  Flu shot-Every year.  Take these steps 1. Do not smoke-Your healthcare provider can help you quit.  For tips on how to quit go to www.smokefree.gov or call 1-800 QUITNOW. 2. Be physically active- Exercise 5 days a week for at least 30 minutes.  If you are not already physically active, start slow and gradually work up to 30 minutes of moderate physical activity.  Examples of moderate activity include walking briskly, dancing, swimming, bicycling, etc. 3. Breast Cancer- A self breast exam every month is important for early  detection of breast cancer.  For more information and instruction on self breast exams, ask your healthcare provider or [place]. 4. Eat a healthy diet- Eat a variety of healthy foods such as fruits, vegetables, whole grains, low fat milk, low fat cheeses, yogurt, lean meats, poultry and fish, beans, nuts, tofu, etc.  For more information go to www. Thenutritionsource.org 5. Drink alcohol in moderation- Limit alcohol intake to one drink or less per day. Never drink and drive. 6. Depression- Your emotional health is as important as your physical health.  If you're feeling down or losing interest in things you normally enjoy please talk to your healthcare provider about being screened for depression. 7. Dental visit- Brush and floss your teeth twice daily; visit your dentist twice a year. 8. Eye doctor- Get an eye exam at least every 2 years. 9. Helmet use- Always wear a helmet when riding a bicycle, motorcycle, rollerblading or skateboarding. 10. Safe sex- If you may be exposed to sexually transmitted infections, use a condom. 11. Seat belts- Seat belts can save your live; always wear one. 12. Smoke/Carbon Monoxide detectors- These detectors need to be installed on the appropriate level of your home. Replace batteries at least once a year. 13. Skin cancer- When out in the sun please cover up and use sunscreen 15 SPF or higher. 14. Violence- If anyone is threatening or hurting you, please tell your healthcare provider.

## 2018-08-29 DIAGNOSIS — G43109 Migraine with aura, not intractable, without status migrainosus: Secondary | ICD-10-CM | POA: Insufficient documentation

## 2018-08-29 DIAGNOSIS — G43709 Chronic migraine without aura, not intractable, without status migrainosus: Secondary | ICD-10-CM | POA: Insufficient documentation

## 2018-09-20 ENCOUNTER — Encounter: Payer: Self-pay | Admitting: Obstetrics and Gynecology

## 2018-09-20 ENCOUNTER — Ambulatory Visit: Payer: BC Managed Care – PPO | Admitting: Obstetrics and Gynecology

## 2018-09-20 VITALS — BP 108/71 | HR 58 | Ht 61.0 in | Wt 111.6 lb

## 2018-09-20 DIAGNOSIS — N912 Amenorrhea, unspecified: Secondary | ICD-10-CM | POA: Diagnosis not present

## 2018-09-20 LAB — POCT URINE PREGNANCY: PREG TEST UR: POSITIVE — AB

## 2018-09-20 NOTE — Progress Notes (Signed)
Patient here for pregnancy confirmation, no complaints.  

## 2018-09-20 NOTE — Progress Notes (Signed)
HPI:      [name] is a 32 y.o. G1P0 who LMP was Patient's last menstrual period was 08/04/2018 (exact date).  Subjective:   She presents today for pregnancy confirmation.  She stopped birth control pills approximately 3 months ago and had 2 menstrual periods.  She has now has had a positive home pregnancy test. She is currently taking prenatal vitamins. She denies medical problems.    Hx: The following portions of the patient's history were reviewed and updated as appropriate:             She  has a past medical history of Migraine and Wears contact lenses. She does not have any pertinent problems on file. She  has a past surgical history that includes Appendectomy and Facial laceration repair (Left, 10/28/2017). Her family history is not on file. She  reports that she has never smoked. She has never used smokeless tobacco. She reports that she drank alcohol. She reports that she does not use drugs. She has a current medication list which includes the following prescription(s): azelastine, dymista, multivitamin-prenatal, and sumatriptan. She has No Known Allergies.       Review of Systems:  Review of Systems  Constitutional: Denied constitutional symptoms, night sweats, recent illness, fatigue, fever, insomnia and weight loss.  Eyes: Denied eye symptoms, eye pain, photophobia, vision change and visual disturbance.  Ears/Nose/Throat/Neck: Denied ear, nose, throat or neck symptoms, hearing loss, nasal discharge, sinus congestion and sore throat.  Cardiovascular: Denied cardiovascular symptoms, arrhythmia, chest pain/pressure, edema, exercise intolerance, orthopnea and palpitations.  Respiratory: Denied pulmonary symptoms, asthma, pleuritic pain, productive sputum, cough, dyspnea and wheezing.  Gastrointestinal: Denied, gastro-esophageal reflux, melena, nausea and vomiting.  Genitourinary: Denied genitourinary symptoms including symptomatic vaginal discharge, pelvic relaxation  issues, and urinary complaints.  Musculoskeletal: Denied musculoskeletal symptoms, stiffness, swelling, muscle weakness and myalgia.  Dermatologic: Denied dermatology symptoms, rash and scar.  Neurologic: Denied neurology symptoms, dizziness, headache, neck pain and syncope.  Psychiatric: Denied psychiatric symptoms, anxiety and depression.  Endocrine: Denied endocrine symptoms including hot flashes and night sweats.   Meds:   Current Outpatient Medications on File Prior to Visit  Medication Sig Dispense Refill  . azelastine (OPTIVAR) 0.05 % ophthalmic solution INSTILL 1 DROP INTO AFFECTED EYE TWICE A DAY  3  . DYMISTA 137-50 MCG/ACT SUSP 1 PUFF IN EACH NOSTRIL TWICE A DAY NASALLY 30 DAYS  6  . Prenatal Vit-Fe Fumarate-FA (MULTIVITAMIN-PRENATAL) 27-0.8 MG TABS tablet Take 1 tablet by mouth daily at 12 noon.    . SUMAtriptan (IMITREX) 100 MG tablet Take 1 tablet (100 mg total) by mouth every 2 (two) hours as needed. For migraines 10 tablet 0   No current facility-administered medications on file prior to visit.     Objective:     Vitals:   09/20/18 0808  BP: 108/71  Pulse: (!) 58              Urinary pregnancy test positive  Assessment:    G1P0 Patient Active Problem List   Diagnosis Date Noted  . Chronic migraine without aura without status migrainosus, not intractable 08/29/2018  . Nasal congestion 10/28/2016  . Elevated LDL cholesterol level 08/12/2014     1. Amenorrhea     First trimester approximately 6 weeks.   Plan:           Prenatal Plan 1.  The patient was given prenatal literature. 2.  She was continued on prenatal vitamins. 3.  A prenatal lab panel was  ordered or drawn. 4.  An ultrasound was ordered to better determine an EDC. 5.  A nurse visit was scheduled. 6.  Genetic testing and testing for other inheritable conditions discussed in detail. She will decide in the future whether to have these labs performed. 7.  A general overview of pregnancy  testing, visit schedule, ultrasound schedule, and prenatal care was discussed.   Orders Orders Placed This Encounter  Procedures  . POCT urine pregnancy    No orders of the defined types were placed in this encounter.     F/U  Return in about 5 weeks (around 10/25/2018). I spent 31 minutes involved in the care of this patient of which greater than 50% was spent discussing current pregnancy, past medical history, overview of prenatal care, dietary restrictions exercise discussions etc.  All questions answered.  Elonda Huskyavid J. Evans, M.D. 09/20/2018 8:41 AM

## 2018-09-28 ENCOUNTER — Ambulatory Visit (INDEPENDENT_AMBULATORY_CARE_PROVIDER_SITE_OTHER): Payer: BC Managed Care – PPO

## 2018-09-28 ENCOUNTER — Other Ambulatory Visit: Payer: Self-pay | Admitting: Obstetrics and Gynecology

## 2018-09-28 DIAGNOSIS — N8311 Corpus luteum cyst of right ovary: Secondary | ICD-10-CM | POA: Diagnosis not present

## 2018-09-28 DIAGNOSIS — O3481 Maternal care for other abnormalities of pelvic organs, first trimester: Secondary | ICD-10-CM

## 2018-09-28 DIAGNOSIS — Z3A08 8 weeks gestation of pregnancy: Secondary | ICD-10-CM

## 2018-09-28 DIAGNOSIS — Z3201 Encounter for pregnancy test, result positive: Secondary | ICD-10-CM

## 2018-10-04 ENCOUNTER — Ambulatory Visit (INDEPENDENT_AMBULATORY_CARE_PROVIDER_SITE_OTHER): Payer: BC Managed Care – PPO | Admitting: Obstetrics and Gynecology

## 2018-10-04 VITALS — BP 124/77 | HR 80 | Ht 61.0 in | Wt 113.9 lb

## 2018-10-04 DIAGNOSIS — Z202 Contact with and (suspected) exposure to infections with a predominantly sexual mode of transmission: Secondary | ICD-10-CM

## 2018-10-04 DIAGNOSIS — N912 Amenorrhea, unspecified: Secondary | ICD-10-CM

## 2018-10-04 DIAGNOSIS — Z3401 Encounter for supervision of normal first pregnancy, first trimester: Secondary | ICD-10-CM

## 2018-10-04 DIAGNOSIS — Z0283 Encounter for blood-alcohol and blood-drug test: Secondary | ICD-10-CM

## 2018-10-04 NOTE — Patient Instructions (Signed)
 How a Baby Grows During Pregnancy  Pregnancy begins when a female's sperm enters a female's egg (fertilization). Fertilization usually happens in one of the tubes (fallopian tubes) that connect the ovaries to the womb (uterus). The fertilized egg moves down the fallopian tube to the uterus. Once it reaches the uterus, it implants into the lining of the uterus and begins to grow. For the first 10 weeks, the fertilized egg is called an embryo. After 10 weeks, it is called a fetus. As the fetus continues to grow, it receives oxygen and nutrients through tissue (placenta) that grows to support the developing baby. The placenta is the life support system for the baby. It provides oxygen and nutrition and removes waste. Learning as much as you can about your pregnancy and how your baby is developing can help you enjoy the experience. It can also make you aware of when there might be a problem and when to ask questions. How long does a typical pregnancy last? A pregnancy usually lasts 280 days, or about 40 weeks. Pregnancy is divided into three periods of growth, also called trimesters:  First trimester: 0-12 weeks.  Second trimester: 13-27 weeks.  Third trimester: 28-40 weeks. The day when your baby is ready to be born (full term) is your estimated date of delivery. How does my baby develop month by month? First month  The fertilized egg attaches to the inside of the uterus.  Some cells will form the placenta. Others will form the fetus.  The arms, legs, brain, spinal cord, lungs, and heart begin to develop.  At the end of the first month, the heart begins to beat. Second month  The bones, inner ear, eyelids, hands, and feet form.  The genitals develop.  By the end of 8 weeks, all major organs are developing. Third month  All of the internal organs are forming.  Teeth develop below the gums.  Bones and muscles begin to grow. The spine can flex.  The skin is  transparent.  Fingernails and toenails begin to form.  Arms and legs continue to grow longer, and hands and feet develop.  The fetus is about 3 inches (7.6 cm) long. Fourth month  The placenta is completely formed.  The external sex organs, neck, outer ear, eyebrows, eyelids, and fingernails are formed.  The fetus can hear, swallow, and move its arms and legs.  The kidneys begin to produce urine.  The skin is covered with a white, waxy coating (vernix) and very fine hair (lanugo). Fifth month  The fetus moves around more and can be felt for the first time (quickening).  The fetus starts to sleep and wake up and may begin to suck its finger.  The nails grow to the end of the fingers.  The organ in the digestive system that makes bile (gallbladder) functions and helps to digest nutrients.  If your baby is a girl, eggs are present in her ovaries. If your baby is a boy, testicles start to move down into his scrotum. Sixth month  The lungs are formed.  The eyes open. The brain continues to develop.  Your baby has fingerprints and toe prints. Your baby's hair grows thicker.  At the end of the second trimester, the fetus is about 9 inches (22.9 cm) long. Seventh month  The fetus kicks and stretches.  The eyes are developed enough to sense changes in light.  The hands can make a grasping motion.  The fetus responds to sound. Eighth month    All organs and body systems are fully developed and functioning.  Bones harden, and taste buds develop. The fetus may hiccup.  Certain areas of the brain are still developing. The skull remains soft. Ninth month  The fetus gains about  lb (0.23 kg) each week.  The lungs are fully developed.  Patterns of sleep develop.  The fetus's head typically moves into a head-down position (vertex) in the uterus to prepare for birth.  The fetus weighs 6-9 lb (2.72-4.08 kg) and is 19-20 inches (48.26-50.8 cm) long. What can I do to have a  healthy pregnancy and help my baby develop? General instructions  Take prenatal vitamins as directed by your health care provider. These include vitamins such as folic acid, iron, calcium, and vitamin D. They are important for healthy development.  Take medicines only as directed by your health care provider. Read labels and ask a pharmacist or your health care provider whether over-the-counter medicines, supplements, and prescription drugs are safe to take during pregnancy.  Keep all follow-up visits as directed by your health care provider. This is important. Follow-up visits include prenatal care and screening tests. How do I know if my baby is developing well? At each prenatal visit, your health care provider will do several different tests to check on your health and keep track of your baby's development. These include:  Fundal height and position. ? Your health care provider will measure your growing belly from your pubic bone to the top of the uterus using a tape measure. ? Your health care provider will also feel your belly to determine your baby's position.  Heartbeat. ? An ultrasound in the first trimester can confirm pregnancy and show a heartbeat, depending on how far along you are. ? Your health care provider will check your baby's heart rate at every prenatal visit.  Second trimester ultrasound. ? This ultrasound checks your baby's development. It also may show your baby's gender. What should I do if I have concerns about my baby's development? Always talk with your health care provider about any concerns that you may have about your pregnancy and your baby. Summary  A pregnancy usually lasts 280 days, or about 40 weeks. Pregnancy is divided into three periods of growth, also called trimesters.  Your health care provider will monitor your baby's growth and development throughout your pregnancy.  Follow your health care provider's recommendations about taking prenatal  vitamins and medicines during your pregnancy.  Talk with your health care provider if you have any concerns about your pregnancy or your developing baby. This information is not intended to replace advice given to you by your health care provider. Make sure you discuss any questions you have with your health care provider. Document Released: 03/22/2008 Document Revised: 08/17/2017 Document Reviewed: 08/17/2017 Elsevier Interactive Patient Education  2019 [employer]  First Trimester of Pregnancy The first trimester of pregnancy is from week 1 until the end of week 13 (months 1 through 3). A week after a sperm fertilizes an egg, the egg will implant on the wall of the uterus. This embryo will begin to develop into a baby. Genes from you and your partner will form the baby. The female genes will determine whether the baby will be a boy or a girl. At 6-8 weeks, the eyes and face will be formed, and the heartbeat can be seen on ultrasound. At the end of 12 weeks, all the baby's organs will be formed. Now that you are pregnant, you will want to   do everything you can to have a healthy baby. Two of the most important things are to get good prenatal care and to follow your health care provider's instructions. Prenatal care is all the medical care you receive before the baby's birth. This care will help prevent, find, and treat any problems during the pregnancy and childbirth. Body changes during your first trimester Your body goes through many changes during pregnancy. The changes vary from woman to woman.  You may gain or lose a couple of pounds at first.  You may feel sick to your stomach (nauseous) and you may throw up (vomit). If the vomiting is uncontrollable, call your health care provider.  You may tire easily.  You may develop headaches that can be relieved by medicines. All medicines should be approved by your health care provider.  You may urinate more often. Painful urination may mean you  have a bladder infection.  You may develop heartburn as a result of your pregnancy.  You may develop constipation because certain hormones are causing the muscles that push stool through your intestines to slow down.  You may develop hemorrhoids or swollen veins (varicose veins).  Your breasts may begin to grow larger and become tender. Your nipples may stick out more, and the tissue that surrounds them (areola) may become darker.  Your gums may bleed and may be sensitive to brushing and flossing.  Dark spots or blotches (chloasma, mask of pregnancy) may develop on your face. This will likely fade after the baby is born.  Your menstrual periods will stop.  You may have a loss of appetite.  You may develop cravings for certain kinds of food.  You may have changes in your emotions from day to day, such as being excited to be pregnant or being concerned that something may go wrong with the pregnancy and baby.  You may have more vivid and strange dreams.  You may have changes in your hair. These can include thickening of your hair, rapid growth, and changes in texture. Some women also have hair loss during or after pregnancy, or hair that feels dry or thin. Your hair will most likely return to normal after your baby is born. What to expect at prenatal visits During a routine prenatal visit:  You will be weighed to make sure you and the baby are growing normally.  Your blood pressure will be taken.  Your abdomen will be measured to track your baby's growth.  The fetal heartbeat will be listened to between weeks 10 and 14 of your pregnancy.  Test results from any previous visits will be discussed. Your health care provider may ask you:  How you are feeling.  If you are feeling the baby move.  If you have had any abnormal symptoms, such as leaking fluid, bleeding, severe headaches, or abdominal cramping.  If you are using any tobacco products, including cigarettes, chewing  tobacco, and electronic cigarettes.  If you have any questions. Other tests that may be performed during your first trimester include:  Blood tests to find your blood type and to check for the presence of any previous infections. The tests will also be used to check for low iron levels (anemia) and protein on red blood cells (Rh antibodies). Depending on your risk factors, or if you previously had diabetes during pregnancy, you may have tests to check for high blood sugar that affects pregnant women (gestational diabetes).  Urine tests to check for infections, diabetes, or protein in the urine.    An ultrasound to confirm the proper growth and development of the baby.  Fetal screens for spinal cord problems (spina bifida) and Down syndrome.  HIV (human immunodeficiency virus) testing. Routine prenatal testing includes screening for HIV, unless you choose not to have this test.  You may need other tests to make sure you and the baby are doing well. Follow these instructions at home: Medicines  Follow your health care provider's instructions regarding medicine use. Specific medicines may be either safe or unsafe to take during pregnancy.  Take a prenatal vitamin that contains at least 600 micrograms (mcg) of folic acid.  If you develop constipation, try taking a stool softener if your health care provider approves. Eating and drinking   Eat a balanced diet that includes fresh fruits and vegetables, whole grains, good sources of protein such as meat, eggs, or tofu, and low-fat dairy. Your health care provider will help you determine the amount of weight gain that is right for you.  Avoid raw meat and uncooked cheese. These carry germs that can cause birth defects in the baby.  Eating four or five small meals rather than three large meals a day may help relieve nausea and vomiting. If you start to feel nauseous, eating a few soda crackers can be helpful. Drinking liquids between meals,  instead of during meals, also seems to help ease nausea and vomiting.  Limit foods that are high in fat and processed sugars, such as fried and sweet foods.  To prevent constipation: ? Eat foods that are high in fiber, such as fresh fruits and vegetables, whole grains, and beans. ? Drink enough fluid to keep your urine clear or pale yellow. Activity  Exercise only as directed by your health care provider. Most women can continue their usual exercise routine during pregnancy. Try to exercise for 30 minutes at least 5 days a week. Exercising will help you: ? Control your weight. ? Stay in shape. ? Be prepared for labor and delivery.  Experiencing pain or cramping in the lower abdomen or lower back is a good sign that you should stop exercising. Check with your health care provider before continuing with normal exercises.  Try to avoid standing for long periods of time. Move your legs often if you must stand in one place for a long time.  Avoid heavy lifting.  Wear low-heeled shoes and practice good posture.  You may continue to have sex unless your health care provider tells you not to. Relieving pain and discomfort  Wear a good support bra to relieve breast tenderness.  Take warm sitz baths to soothe any pain or discomfort caused by hemorrhoids. Use hemorrhoid cream if your health care provider approves.  Rest with your legs elevated if you have leg cramps or low back pain.  If you develop varicose veins in your legs, wear support hose. Elevate your feet for 15 minutes, 3-4 times a day. Limit salt in your diet. Prenatal care  Schedule your prenatal visits by the twelfth week of pregnancy. They are usually scheduled monthly at first, then more often in the last 2 months before delivery.  Write down your questions. Take them to your prenatal visits.  Keep all your prenatal visits as told by your health care provider. This is important. Safety  Wear your seat belt at all times  when driving.  Make a list of emergency phone numbers, including numbers for family, friends, the hospital, and police and fire departments. General instructions  Ask your health care   provider for a referral to a local prenatal education class. Begin classes no later than the beginning of month 6 of your pregnancy.  Ask for help if you have counseling or nutritional needs during pregnancy. Your health care provider can offer advice or refer you to specialists for help with various needs.  Do not use hot tubs, steam rooms, or saunas.  Do not douche or use tampons or scented sanitary pads.  Do not cross your legs for long periods of time.  Avoid cat litter boxes and soil used by cats. These carry germs that can cause birth defects in the baby and possibly loss of the fetus by miscarriage or stillbirth.  Avoid all smoking, herbs, alcohol, and medicines not prescribed by your health care provider. Chemicals in these products affect the formation and growth of the baby.  Do not use any products that contain nicotine or tobacco, such as cigarettes and e-cigarettes. If you need help quitting, ask your health care provider. You may receive counseling support and other resources to help you quit.  Schedule a dentist appointment. At home, brush your teeth with a soft toothbrush and be gentle when you floss. Contact a health care provider if:  You have dizziness.  You have mild pelvic cramps, pelvic pressure, or nagging pain in the abdominal area.  You have persistent nausea, vomiting, or diarrhea.  You have a bad smelling vaginal discharge.  You have pain when you urinate.  You notice increased swelling in your face, hands, legs, or ankles.  You are exposed to fifth disease or chickenpox.  You are exposed to German measles (rubella) and have never had it. Get help right away if:  You have a fever.  You are leaking fluid from your vagina.  You have spotting or bleeding from your  vagina.  You have severe abdominal cramping or pain.  You have rapid weight gain or loss.  You vomit blood or material that looks like coffee grounds.  You develop a severe headache.  You have shortness of breath.  You have any kind of trauma, such as from a fall or a car accident. Summary  The first trimester of pregnancy is from week 1 until the end of week 13 (months 1 through 3).  Your body goes through many changes during pregnancy. The changes vary from woman to woman.  You will have routine prenatal visits. During those visits, your health care provider will examine you, discuss any test results you may have, and talk with you about how you are feeling. This information is not intended to replace advice given to you by your health care provider. Make sure you discuss any questions you have with your health care provider. Document Released: 09/28/2001 Document Revised: 09/15/2016 Document Reviewed: 09/15/2016 Elsevier Interactive Patient Education  2019 [employer]   Commonly Asked Questions During Pregnancy  Cats: A parasite can be excreted in cat feces.  To avoid exposure you need to have another person empty the little box.  If you must empty the litter box you will need to wear gloves.  Wash your hands after handling your cat.  This parasite can also be found in raw or undercooked meat so this should also be avoided.  Colds, Sore Throats, Flu: Please check your medication sheet to see what you can take for symptoms.  If your symptoms are unrelieved by these medications please call the office.  Dental Work: Most any dental work your dentist recommends is permitted.  X-rays should only   be taken during the first trimester if absolutely necessary.  Your abdomen should be shielded with a lead apron during all x-rays.  Please notify your provider prior to receiving any x-rays.  Novocaine is fine; gas is not recommended.  If your dentist requires a note from us prior to dental  work please call the office and we will provide one for you.  Exercise: Exercise is an important part of staying healthy during your pregnancy.  You may continue most exercises you were accustomed to prior to pregnancy.  Later in your pregnancy you will most likely notice you have difficulty with activities requiring balance like riding a bicycle.  It is important that you listen to your body and avoid activities that put you at a higher risk of falling.  Adequate rest and staying well hydrated are a must!  If you have questions about the safety of specific activities ask your provider.    Exposure to Children with illness: Try to avoid obvious exposure; report any symptoms to us when noted,  If you have chicken pos, red measles or mumps, you should be immune to these diseases.   Please do not take any vaccines while pregnant unless you have checked with your OB provider.  Fetal Movement: After 28 weeks we recommend you do "kick counts" twice daily.  Lie or sit down in a calm quiet environment and count your baby movements "kicks".  You should feel your baby at least 10 times per hour.  If you have not felt 10 kicks within the first hour get up, walk around and have something sweet to eat or drink then repeat for an additional hour.  If count remains less than 10 per hour notify your provider.  Fumigating: Follow your pest control agent's advice as to how long to stay out of your home.  Ventilate the area well before re-entering.  Hemorrhoids:   Most over-the-counter preparations can be used during pregnancy.  Check your medication to see what is safe to use.  It is important to use a stool softener or fiber in your diet and to drink lots of liquids.  If hemorrhoids seem to be getting worse please call the office.   Hot Tubs:  Hot tubs Jacuzzis and saunas are not recommended while pregnant.  These increase your internal body temperature and should be avoided.  Intercourse:  Sexual intercourse is safe  during pregnancy as long as you are comfortable, unless otherwise advised by your provider.  Spotting may occur after intercourse; report any bright red bleeding that is heavier than spotting.  Labor:  If you know that you are in labor, please go to the hospital.  If you are unsure, please call the office and let us help you decide what to do.  Lifting, straining, etc:  If your job requires heavy lifting or straining please check with your provider for any limitations.  Generally, you should not lift items heavier than that you can lift simply with your hands and arms (no back muscles)  Painting:  Paint fumes do not harm your pregnancy, but may make you ill and should be avoided if possible.  Latex or water based paints have less odor than oils.  Use adequate ventilation while painting.  Permanents & Hair Color:  Chemicals in hair dyes are not recommended as they cause increase hair dryness which can increase hair loss during pregnancy.  " Highlighting" and permanents are allowed.  Dye may be absorbed differently and permanents may not hold   as well during pregnancy.  Sunbathing:  Use a sunscreen, as skin burns easily during pregnancy.  Drink plenty of fluids; avoid over heating.  Tanning Beds:  Because their possible side effects are still unknown, tanning beds are not recommended.  Ultrasound Scans:  Routine ultrasounds are performed at approximately 20 weeks.  You will be able to see your baby's general anatomy an if you would like to know the gender this can usually be determined as well.  If it is questionable when you conceived you may also receive an ultrasound early in your pregnancy for dating purposes.  Otherwise ultrasound exams are not routinely performed unless there is a medical necessity.  Although you can request a scan we ask that you pay for it when conducted because insurance does not cover " patient request" scans.  Work: If your pregnancy proceeds without complications you may  work until your due date, unless your physician or employer advises otherwise.  Round Ligament Pain/Pelvic Discomfort:  Sharp, shooting pains not associated with bleeding are fairly common, usually occurring in the second trimester of pregnancy.  They tend to be worse when standing up or when you remain standing for long periods of time.  These are the result of pressure of certain pelvic ligaments called "round ligaments".  Rest, Tylenol and heat seem to be the most effective relief.  As the womb and fetus grow, they rise out of the pelvis and the discomfort improves.  Please notify the office if your pain seems different than that described.  It may represent a more serious condition.   

## 2018-10-04 NOTE — Progress Notes (Signed)
Doy MinceJessica N Scovill presents for NOB nurse interview visit.  [facility].  G-1 P-0.  LMP 08/04/2018.  EDD 05/10/2019. Dating scan done 09/28/2018 8 weeks 0 days.  Pregnancy education material explained and given.  No cats in the home.  NOB labs ordered.  TSH/HgA1C not ordered BMI.Body mass index is 21.52 kg/m.  Parvovirus lab ordered, patient is a [employer]. Sickle cell labs not ordered. HIV and drug screen were explained and ordered.  PNV encouraged.  Genetic screening options discussed.  Genetic testing: Unsure.

## 2018-10-05 LAB — MONITOR DRUG PROFILE 14(MW)
Amphetamine Scrn, Ur: NEGATIVE ng/mL
BARBITURATE SCREEN URINE: NEGATIVE ng/mL
BENZODIAZEPINE SCREEN, URINE: NEGATIVE ng/mL
Buprenorphine, Urine: NEGATIVE ng/mL
CANNABINOIDS UR QL SCN: NEGATIVE ng/mL
Cocaine (Metab) Scrn, Ur: NEGATIVE ng/mL
Creatinine(Crt), U: 109.7 mg/dL (ref 20.0–300.0)
Fentanyl, Urine: NEGATIVE pg/mL
Meperidine Screen, Urine: NEGATIVE ng/mL
Methadone Screen, Urine: NEGATIVE ng/mL
OXYCODONE+OXYMORPHONE UR QL SCN: NEGATIVE ng/mL
Opiate Scrn, Ur: NEGATIVE ng/mL
Ph of Urine: 5.9 (ref 4.5–8.9)
Phencyclidine Qn, Ur: NEGATIVE ng/mL
Propoxyphene Scrn, Ur: NEGATIVE ng/mL
SPECIFIC GRAVITY: 1.017
Tramadol Screen, Urine: NEGATIVE ng/mL

## 2018-10-05 LAB — URINALYSIS, ROUTINE W REFLEX MICROSCOPIC
Bilirubin, UA: NEGATIVE
Glucose, UA: NEGATIVE
Ketones, UA: NEGATIVE
Leukocytes, UA: NEGATIVE
NITRITE UA: NEGATIVE
Protein, UA: NEGATIVE
RBC, UA: NEGATIVE
Specific Gravity, UA: 1.021 (ref 1.005–1.030)
UUROB: 0.2 mg/dL (ref 0.2–1.0)
pH, UA: 5.5 (ref 5.0–7.5)

## 2018-10-05 LAB — HEPATITIS B SURFACE ANTIGEN: HEP B S AG: NEGATIVE

## 2018-10-05 LAB — VARICELLA ZOSTER ANTIBODY, IGG: VARICELLA: 833 {index} (ref >165)

## 2018-10-05 LAB — RUBELLA SCREEN: Rubella Antibodies, IGG: 4.63 index (ref >0.99)

## 2018-10-05 LAB — HIV ANTIBODY (ROUTINE TESTING W REFLEX): HIV SCREEN 4TH GENERATION: NONREACTIVE

## 2018-10-05 LAB — NICOTINE SCREEN, URINE: Cotinine Ql Scrn, Ur: NEGATIVE ng/mL

## 2018-10-05 LAB — ANTIBODY SCREEN: Antibody Screen: NEGATIVE

## 2018-10-05 LAB — HGB SOLU + RFLX FRAC: SICKLE SOLUBILITY TEST - HGBRFX: NEGATIVE

## 2018-10-05 LAB — PARVOVIRUS B19 ANTIBODY, IGG AND IGM
Parvovirus B19 IgG: 4.6 index — ABNORMAL HIGH (ref 0.0–0.8)
Parvovirus B19 IgM: 0.2 index (ref 0.0–0.8)

## 2018-10-05 LAB — RPR: RPR Ser Ql: NONREACTIVE

## 2018-10-05 LAB — ABO AND RH: Rh Factor: POSITIVE

## 2018-10-06 LAB — URINE CULTURE, OB REFLEX

## 2018-10-06 LAB — CULTURE, OB URINE

## 2018-10-07 LAB — GC/CHLAMYDIA PROBE AMP
Chlamydia trachomatis, NAA: NEGATIVE
NEISSERIA GONORRHOEAE BY PCR: NEGATIVE

## 2018-10-18 NOTE — L&D Delivery Note (Signed)
Delivery Summary for Allison Carney  Labor Events:   Preterm labor:   Rupture date:   Rupture time:   Rupture type: Intact Possible ROM - for evaluation  Fluid Color:   Induction:   Augmentation:   Complications:   Cervical ripening:          Delivery:   Episiotomy:   Lacerations:   Repair suture:   Repair # of packets:   Blood loss (ml): 250   Information for the patient's newborn:  Seleta, [place] Boy Shamela [[id number]]    Delivery 05/12/2019 8:22 AM by  Vaginal, Spontaneous Sex:  female Gestational Age: [redacted]w[redacted]d Delivery Clinician:   Living?:         APGARS  One minute Five minutes Ten minutes  Skin color:        Heart rate:        Grimace:        Muscle tone:        Breathing:        Totals: 8  9      Presentation/position:      Resuscitation:   Cord information:    Disposition of cord blood:     Blood gases sent?  Complications:   Placenta: Delivered:       appearance Newborn Measurements: Weight: 8 lb 7.8 oz (3850 g)  Height: 21.65"  Head circumference:    Chest circumference:    Other providers:    Additional  information: Forceps:   Vacuum:   Breech:   Observed anomalies        Delivery Note At 8:22 AM a viable and healthy female was delivered via Vaginal, Spontaneous (Presentation: Vertex; LOA position).  APGAR: 8, 9; weight  3850 grams.   Placenta status: spontaneously removed, intact.  Cord: 3-vessel with the following complications: None.  Cord pH: not obtained.  Delayed cord clamping observed.   Anesthesia:  None Episiotomy: None Lacerations: None Suture Repair: None Est. Blood Loss (mL):  250  Mom to postpartum.  Baby to Couplet care / Skin to Skin.  Rubie Maid, MD 05/12/2019, 9:06 AM

## 2018-10-25 ENCOUNTER — Encounter: Payer: BC Managed Care – PPO | Admitting: Obstetrics and Gynecology

## 2018-10-31 ENCOUNTER — Encounter: Payer: Self-pay | Admitting: Obstetrics and Gynecology

## 2018-10-31 ENCOUNTER — Ambulatory Visit (INDEPENDENT_AMBULATORY_CARE_PROVIDER_SITE_OTHER): Payer: BC Managed Care – PPO | Admitting: Obstetrics and Gynecology

## 2018-10-31 ENCOUNTER — Other Ambulatory Visit (HOSPITAL_COMMUNITY)
Admission: RE | Admit: 2018-10-31 | Discharge: 2018-10-31 | Disposition: A | Discharge status: Home / Self Care | Payer: BC Managed Care – PPO | Source: Ambulatory Visit | Attending: Obstetrics and Gynecology | Admitting: Obstetrics and Gynecology

## 2018-10-31 VITALS — BP 124/85 | HR 86 | Wt 113.0 lb

## 2018-10-31 DIAGNOSIS — Z3401 Encounter for supervision of normal first pregnancy, first trimester: Secondary | ICD-10-CM

## 2018-10-31 LAB — POCT URINALYSIS DIPSTICK OB
BILIRUBIN UA: NEGATIVE
Blood, UA: NEGATIVE
Glucose, UA: NEGATIVE
Ketones, UA: NEGATIVE
Leukocytes, UA: NEGATIVE
Nitrite, UA: NEGATIVE
POC,PROTEIN,UA: NEGATIVE
Spec Grav, UA: 1.01 (ref 1.010–1.025)
Urobilinogen, UA: 0.2 E.U./dL
pH, UA: 6 (ref 5.0–8.0)

## 2018-10-31 NOTE — Progress Notes (Signed)
NOB: Patient without complaints.  She is feeling well.  Desires genetic testing today and AFP at next visit.  Pap performed today.  Physical examination General NAD, Conversant  HEENT Atraumatic; Op clear with mmm.  Normo-cephalic. Pupils reactive. Anicteric sclerae  Thyroid/Neck Smooth without nodularity or enlargement. Normal ROM.  Neck Supple.  Skin No rashes, lesions or ulceration. Normal palpated skin turgor. No nodularity.  Breasts: No masses or discharge.  Symmetric.  No axillary adenopathy.  Lungs: Clear to auscultation.No rales or wheezes. Normal Respiratory effort, no retractions.  Heart: NSR.  No murmurs or rubs appreciated. No periferal edema  Abdomen: Soft.  Non-tender.  No masses.  No HSM. No hernia  Extremities: Moves all appropriately.  Normal ROM for age. No lymphadenopathy.  Neuro: Oriented to PPT.  Normal mood. Normal affect.     Pelvic:   Vulva: Normal appearance.  No lesions.  Vagina: No lesions or abnormalities noted.  Support: Normal pelvic support.  Urethra No masses tenderness or scarring.  Meatus Normal size without lesions or prolapse.  Cervix: Normal appearance.  No lesions.  Anus: Normal exam.  No lesions.  Perineum: Normal exam.  No lesions.        Bimanual   Adnexae: No masses.  Non-tender to palpation.  Uterus: Enlarged. 12 wks  POS FHTs  Non-tender.  Mobile.  AV.  Adnexae: No masses.  Non-tender to palpation.  Cul-de-sac: Negative for abnormality.  Adnexae: No masses.  Non-tender to palpation.         Pelvimetry   Diagonal: Reached.  Spines: Average.  Sacrum: Concave.  Pubic Arch: Normal.

## 2018-10-31 NOTE — Progress Notes (Signed)
Patient comes in today for her new OB visit. She is due for Pap today. No problems or concerns today. She would like to genetic testing today

## 2018-11-02 LAB — CYTOLOGY - PAP: Diagnosis: NEGATIVE

## 2018-12-06 ENCOUNTER — Ambulatory Visit (INDEPENDENT_AMBULATORY_CARE_PROVIDER_SITE_OTHER): Payer: BC Managed Care – PPO | Admitting: Obstetrics and Gynecology

## 2018-12-06 VITALS — BP 117/72 | HR 90 | Wt 119.6 lb

## 2018-12-06 DIAGNOSIS — Z3482 Encounter for supervision of other normal pregnancy, second trimester: Secondary | ICD-10-CM

## 2018-12-06 LAB — POCT URINALYSIS DIPSTICK OB
Bilirubin, UA: NEGATIVE
Blood, UA: NEGATIVE
Glucose, UA: NEGATIVE
Ketones, UA: NEGATIVE
Leukocytes, UA: NEGATIVE
Nitrite, UA: NEGATIVE
POC,PROTEIN,UA: NEGATIVE
Spec Grav, UA: <=1.005 — AB (ref 1.010–1.025)
UROBILINOGEN UA: 0.2 U/dL
pH, UA: 6.5 (ref 5.0–8.0)

## 2018-12-06 NOTE — Progress Notes (Signed)
ROB: Doing well, no complaints.  Patient received flu vaccine in October, up to date. Normal Panorama, female infant. Declines AFP. For anatomy scan in 2 weeks, for OB appt in 4 weeks.

## 2018-12-06 NOTE — Progress Notes (Signed)
ROB-Pt stated that she was doing well. No complaints.

## 2018-12-06 NOTE — Patient Instructions (Signed)

## 2018-12-07 ENCOUNTER — Other Ambulatory Visit: Payer: Self-pay

## 2018-12-07 ENCOUNTER — Telehealth: Payer: Self-pay | Admitting: Obstetrics and Gynecology

## 2018-12-07 DIAGNOSIS — Z3482 Encounter for supervision of other normal pregnancy, second trimester: Secondary | ICD-10-CM

## 2018-12-07 NOTE — Telephone Encounter (Signed)
Error

## 2018-12-21 ENCOUNTER — Ambulatory Visit
Admission: RE | Admit: 2018-12-21 | Discharge: 2018-12-21 | Disposition: A | Discharge status: Home / Self Care | Payer: BC Managed Care – PPO | Source: Ambulatory Visit | Attending: Obstetrics and Gynecology | Admitting: Obstetrics and Gynecology

## 2018-12-21 ENCOUNTER — Other Ambulatory Visit: Payer: Self-pay

## 2018-12-21 DIAGNOSIS — Z3482 Encounter for supervision of other normal pregnancy, second trimester: Secondary | ICD-10-CM | POA: Insufficient documentation

## 2018-12-25 NOTE — Progress Notes (Signed)
Contacted patient regarding ultrasound findings. Discussed results and recommendations to repeat ultrasound in 6-8 weeks.  Review of genetics notes no increased risk for trisomy 69.

## 2019-01-03 ENCOUNTER — Encounter: Payer: Self-pay | Admitting: Obstetrics and Gynecology

## 2019-01-03 ENCOUNTER — Other Ambulatory Visit: Payer: Self-pay

## 2019-01-03 ENCOUNTER — Ambulatory Visit (INDEPENDENT_AMBULATORY_CARE_PROVIDER_SITE_OTHER): Payer: BC Managed Care – PPO | Admitting: Obstetrics and Gynecology

## 2019-01-03 VITALS — BP 120/68 | HR 91 | Ht 61.0 in | Wt 124.4 lb

## 2019-01-03 DIAGNOSIS — Z3482 Encounter for supervision of other normal pregnancy, second trimester: Secondary | ICD-10-CM

## 2019-01-03 LAB — POCT URINALYSIS DIPSTICK OB
BILIRUBIN UA: NEGATIVE
Blood, UA: NEGATIVE
Glucose, UA: NEGATIVE
Ketones, UA: NEGATIVE
Leukocytes, UA: NEGATIVE
Nitrite, UA: NEGATIVE
POC,PROTEIN,UA: NEGATIVE
Spec Grav, UA: 1.015 (ref 1.010–1.025)
Urobilinogen, UA: 0.2 E.U./dL
pH, UA: 6.5 (ref 5.0–8.0)

## 2019-01-03 NOTE — Progress Notes (Signed)
Patient comes in today for her ROB visit. She has no concerns today.

## 2019-01-03 NOTE — Progress Notes (Signed)
ROB: No complaints.  Reports fetal movement.  Ultrasound scheduled for follow-up with next visit.  Further discussed nuchal fold and renal pyelectasis.

## 2019-01-31 ENCOUNTER — Other Ambulatory Visit: Payer: Self-pay

## 2019-01-31 ENCOUNTER — Ambulatory Visit (INDEPENDENT_AMBULATORY_CARE_PROVIDER_SITE_OTHER): Payer: BC Managed Care – PPO

## 2019-01-31 ENCOUNTER — Encounter: Payer: Self-pay | Admitting: Obstetrics and Gynecology

## 2019-01-31 ENCOUNTER — Ambulatory Visit (INDEPENDENT_AMBULATORY_CARE_PROVIDER_SITE_OTHER): Payer: BC Managed Care – PPO | Admitting: Obstetrics and Gynecology

## 2019-01-31 VITALS — BP 118/76 | HR 96 | Wt 128.5 lb

## 2019-01-31 DIAGNOSIS — Z3482 Encounter for supervision of other normal pregnancy, second trimester: Secondary | ICD-10-CM | POA: Diagnosis not present

## 2019-01-31 DIAGNOSIS — O35EXX0 Maternal care for other (suspected) fetal abnormality and damage, fetal genitourinary anomalies, not applicable or unspecified: Secondary | ICD-10-CM | POA: Insufficient documentation

## 2019-01-31 DIAGNOSIS — O358XX0 Maternal care for other (suspected) fetal abnormality and damage, not applicable or unspecified: Secondary | ICD-10-CM

## 2019-01-31 DIAGNOSIS — Z3402 Encounter for supervision of normal first pregnancy, second trimester: Secondary | ICD-10-CM

## 2019-01-31 LAB — POCT URINALYSIS DIPSTICK OB
Bilirubin, UA: NEGATIVE
Blood, UA: NEGATIVE
Glucose, UA: NEGATIVE
Leukocytes, UA: NEGATIVE
Nitrite, UA: NEGATIVE
POC,PROTEIN,UA: NEGATIVE
Spec Grav, UA: 1.02 (ref 1.010–1.025)
Urobilinogen, UA: 0.2 E.U./dL
pH, UA: 6 (ref 5.0–8.0)

## 2019-01-31 NOTE — Progress Notes (Signed)
ROB-Pt present today for routine prenatal care. Pt stated that she is doing well and has no problems at this time to report.

## 2019-01-31 NOTE — Progress Notes (Signed)
ROB: Doing well, no problems.  F/u ultrasound for renal pyelectasis done today, still present but mild.  Advised that it usually resolves by birth, but if not, can complete further workup after delivery. RTC in 4 weeks. For 28 week labs at that time.

## 2019-01-31 NOTE — Patient Instructions (Signed)
GESTATIONAL DIABETES TESTING FOR PREGNANCY  Pregnant women can develop a condition known as Gestational Diabetes (diabetes brought on by pregnancy) which can pose a risk to both mother and baby. A glucose tolerance test is a common type of testing for potential gestational diabetes.  There are several tests intended to identify gestational diabetes in pregnant women. The first, called the Glucose Challenge Screening, is a preliminary screening test performed between 26-28 weeks. If a woman tests positive during this screening test, the second test, called the Glucose Tolerance Test, may be performed. This test will diagnose whether diabetes exists or not by indicating whether or not the body is using glucose (a type of sugar) effectively.  The Glucose Challenge Screening is now considered to be a standard test performed during the early part of the third trimester of pregnancy.  What is the Glucose Challenge Screening Test? No preparation is required prior to the test. During the test, the mother is asked to drink a sweet liquid (glucose) and then will have blood drawn one hour from having the drink, as blood glucose levels normally peak within one hour. No fasting is required prior to this test.  The test evaluates how your body processes sugar. A high level in your blood may indicate your body is not processing sugar effectively (positive test). If the results of this screen are positive, the woman may have the Glucose Tolerance Test performed. It is important to note that not all women who test positive for the Glucose Challenge Screening test are found to have diabetes upon further diagnosis.  What is the Glucose Tolerance Test? Prior to the taking the glucose tolerance test, your doctor will ask you to make sure and eat at least 150mg of carbohydrates (about what you will get from a slice or two of bread) for three days prior to the time you will be asked to fast. You will not be permitted to eat  or drink anything but sips of water for 14 hours prior to the test, so it is best to schedule the test for first thing in the morning.  Additionally, you should plan to have someone drive you to and from the test since your energy levels may be low and there is a slight possibility you may feel light-headed.  When you arrive, the technician will draw blood to measure your baseline "fasting blood glucose level". You will be asked to drink a larger volume (or more concentrated solution) of the glucose drink than was used in the initial Glucose Challenge Screening test. Your blood will be drawn and tested every hour for the next three hours.  The following are the values that the American Diabetes Association considers to be abnormal during the Glucose Tolerance Test:  Interval Abnormal reading Fasting 95 mg/dl or higher One hour 180 mg/dl or higher Two hours 155 mg/dl or higher Three hours 140 mg/dl or higher  What if my Glucose Tolerance Test Results are Abnormal? If only one of your readings comes back abnormal, your doctor may suggest some changes to your diet and/or test you again later in the pregnancy. If two or more of your readings come back abnormal, you'll be diagnosed with Gestational Diabetes and your doctor or midwife will talk to you about a treatment plan. Treating diabetes during pregnancy is extremely important to protect the health of both mother and baby.   Compiled using information from the following sources:  1. American Diabetes Association  https://www.diabetes.org  2. E[employer]  https://www.emedicine.com    3. National Institute of Diabetes and Digestive and Kidney Diseases  

## 2019-02-28 ENCOUNTER — Telehealth: Payer: Self-pay

## 2019-02-28 NOTE — Telephone Encounter (Signed)
Coronavirus (COVID-19) Are you at risk?  Are you at risk for the Coronavirus (COVID-19)?  To be considered HIGH RISK for Coronavirus (COVID-19), you have to meet the following criteria:  . Traveled to [place], [place], [place], [place] or [place]; or in the [place] to [place], [place], [place], or [place]; and have fever, cough, and shortness of breath within the last 2 weeks of travel OR . Been in close contact with a person diagnosed with COVID-19 within the last 2 weeks and have fever, cough, and shortness of breath . IF YOU DO NOT MEET THESE CRITERIA, YOU ARE CONSIDERED LOW RISK FOR COVID-19.  What to do if you are HIGH RISK for COVID-19?  . If you are having a medical emergency, call 911. . Seek medical care right away. Before you go to a doctor's office, urgent care or emergency department, call ahead and tell them about your recent travel, contact with someone diagnosed with COVID-19, and your symptoms. You should receive instructions from your physician's office regarding next steps of care.  . When you arrive at healthcare provider, tell the healthcare staff immediately you have returned from visiting [place], [place], [place], [place] or [place]; or traveled in the [place] to [place], [place], [place], or [place]; in the last two weeks or you have been in close contact with a person diagnosed with COVID-19 in the last 2 weeks.   . Tell the health care staff about your symptoms: fever, cough and shortness of breath. . After you have been seen by a medical provider, you will be either: o Tested for (COVID-19) and discharged home on quarantine except to seek medical care if symptoms worsen, and asked to  - Stay home and avoid contact with others until you get your results (4-5 days)  - Avoid travel on public transportation if possible (such as bus, train, or airplane) or o Sent to the Emergency Department by EMS for evaluation, COVID-19 testing, and possible  admission depending on your condition and test results.  What to do if you are LOW RISK for COVID-19?  Reduce your risk of any infection by using the same precautions used for avoiding the common cold or flu:  . Wash your hands often with soap and warm water for at least 20 seconds.  If soap and water are not readily available, use an alcohol-based hand sanitizer with at least 60% alcohol.  . If coughing or sneezing, cover your mouth and nose by coughing or sneezing into the elbow areas of your shirt or coat, into a tissue or into your sleeve (not your hands). . Avoid shaking hands with others and consider head nods or verbal greetings only. . Avoid touching your eyes, nose, or mouth with unwashed hands.  . Avoid close contact with people who are sick. . Avoid places or events with large numbers of people in one location, like concerts or sporting events. . Carefully consider travel plans you have or are making. . If you are planning any travel outside or inside the [place], visit the CDC's Travelers' Health webpage for the latest health notices. . If you have some symptoms but not all symptoms, continue to monitor at home and seek medical attention if your symptoms worsen. . If you are having a medical emergency, call 911.   ADDITIONAL HEALTHCARE OPTIONS FOR PATIENTS  [facility] / e-Visit: [email or web address]         [facility]: [phone]  [facility]: [phone]                   [facility]: [phone]  Prescreened. cm 

## 2019-03-01 ENCOUNTER — Ambulatory Visit (INDEPENDENT_AMBULATORY_CARE_PROVIDER_SITE_OTHER): Payer: BC Managed Care – PPO | Admitting: Obstetrics and Gynecology

## 2019-03-01 ENCOUNTER — Other Ambulatory Visit: Payer: BC Managed Care – PPO

## 2019-03-01 ENCOUNTER — Other Ambulatory Visit: Payer: Self-pay

## 2019-03-01 VITALS — BP 123/76 | HR 93 | Ht 61.0 in | Wt 135.1 lb

## 2019-03-01 DIAGNOSIS — Z3402 Encounter for supervision of normal first pregnancy, second trimester: Secondary | ICD-10-CM

## 2019-03-01 DIAGNOSIS — O35EXX0 Maternal care for other (suspected) fetal abnormality and damage, fetal genitourinary anomalies, not applicable or unspecified: Secondary | ICD-10-CM

## 2019-03-01 DIAGNOSIS — Z23 Encounter for immunization: Secondary | ICD-10-CM

## 2019-03-01 DIAGNOSIS — O358XX0 Maternal care for other (suspected) fetal abnormality and damage, not applicable or unspecified: Secondary | ICD-10-CM

## 2019-03-01 LAB — POCT URINALYSIS DIPSTICK OB
Bilirubin, UA: NEGATIVE
Blood, UA: NEGATIVE
Glucose, UA: NEGATIVE
Ketones, UA: NEGATIVE
Leukocytes, UA: NEGATIVE
Nitrite, UA: NEGATIVE
POC,PROTEIN,UA: NEGATIVE
Spec Grav, UA: 1.01 (ref 1.010–1.025)
Urobilinogen, UA: 0.2 E.U./dL
pH, UA: 6.5 (ref 5.0–8.0)

## 2019-03-01 NOTE — Progress Notes (Signed)
ROB:   No complaints.  Denies contractions.  Reports active fetal movement.  1 hour GCT today.  Discussed fetal renal pyelectasis-follow-up ultrasound at next visit.

## 2019-03-01 NOTE — Progress Notes (Signed)
Patient comes in today for ROB visit. Glucose, Tdap, and BTC done today. She has no concerns today.

## 2019-03-02 LAB — CBC
Hematocrit: 36.2 % (ref 34.0–46.6)
Hemoglobin: 12.5 g/dL (ref 11.1–15.9)
MCH: 30.8 pg (ref 26.6–33.0)
MCHC: 34.5 g/dL (ref 31.5–35.7)
MCV: 89 fL (ref 79–97)
Platelets: 206 10*3/uL (ref 150–450)
RBC: 4.06 x10E6/uL (ref 3.77–5.28)
RDW: 12.1 % (ref 11.7–15.4)
WBC: 6.7 10*3/uL (ref 3.4–10.8)

## 2019-03-02 LAB — RPR: RPR Ser Ql: NONREACTIVE

## 2019-03-02 LAB — GLUCOSE, 1 HOUR GESTATIONAL: Gestational Diabetes Screen: 133 mg/dL (ref 65–139)

## 2019-03-20 ENCOUNTER — Telehealth: Payer: Self-pay

## 2019-03-20 NOTE — Telephone Encounter (Signed)
Coronavirus (COVID-19) Are you at risk?  Are you at risk for the Coronavirus (COVID-19)?  To be considered HIGH RISK for Coronavirus (COVID-19), you have to meet the following criteria:  . Traveled to [place], [place], [place], [place] or [place]; or in the [place] to [place], [place], [place], or [place]; and have fever, cough, and shortness of breath within the last 2 weeks of travel OR . Been in close contact with a person diagnosed with COVID-19 within the last 2 weeks and have fever, cough, and shortness of breath . IF YOU DO NOT MEET THESE CRITERIA, YOU ARE CONSIDERED LOW RISK FOR COVID-19.  What to do if you are HIGH RISK for COVID-19?  . If you are having a medical emergency, call 911. . Seek medical care right away. Before you go to a doctor's office, urgent care or emergency department, call ahead and tell them about your recent travel, contact with someone diagnosed with COVID-19, and your symptoms. You should receive instructions from your physician's office regarding next steps of care.  . When you arrive at healthcare provider, tell the healthcare staff immediately you have returned from visiting [place], [place], [place], [place] or [place]; or traveled in the [place] to [place], [place], [place], or [place]; in the last two weeks or you have been in close contact with a person diagnosed with COVID-19 in the last 2 weeks.   . Tell the health care staff about your symptoms: fever, cough and shortness of breath. . After you have been seen by a medical provider, you will be either: o Tested for (COVID-19) and discharged home on quarantine except to seek medical care if symptoms worsen, and asked to  - Stay home and avoid contact with others until you get your results (4-5 days)  - Avoid travel on public transportation if possible (such as bus, train, or airplane) or o Sent to the Emergency Department by EMS for evaluation, COVID-19 testing, and possible  admission depending on your condition and test results.  What to do if you are LOW RISK for COVID-19?  Reduce your risk of any infection by using the same precautions used for avoiding the common cold or flu:  . Wash your hands often with soap and warm water for at least 20 seconds.  If soap and water are not readily available, use an alcohol-based hand sanitizer with at least 60% alcohol.  . If coughing or sneezing, cover your mouth and nose by coughing or sneezing into the elbow areas of your shirt or coat, into a tissue or into your sleeve (not your hands). . Avoid shaking hands with others and consider head nods or verbal greetings only. . Avoid touching your eyes, nose, or mouth with unwashed hands.  . Avoid close contact with people who are sick. . Avoid places or events with large numbers of people in one location, like concerts or sporting events. . Carefully consider travel plans you have or are making. . If you are planning any travel outside or inside the [place], visit the CDC's Travelers' Health webpage for the latest health notices. . If you have some symptoms but not all symptoms, continue to monitor at home and seek medical attention if your symptoms worsen. . If you are having a medical emergency, call 911.   ADDITIONAL HEALTHCARE OPTIONS FOR PATIENTS  [facility] / e-Visit: [email or web address]         [facility]: [phone]  [facility]: [phone]                   [facility]: [phone]   Pre-screen negative, DM.   

## 2019-03-21 ENCOUNTER — Ambulatory Visit (INDEPENDENT_AMBULATORY_CARE_PROVIDER_SITE_OTHER): Payer: BC Managed Care – PPO | Admitting: Obstetrics and Gynecology

## 2019-03-21 ENCOUNTER — Ambulatory Visit (INDEPENDENT_AMBULATORY_CARE_PROVIDER_SITE_OTHER): Payer: BC Managed Care – PPO

## 2019-03-21 ENCOUNTER — Other Ambulatory Visit: Payer: Self-pay

## 2019-03-21 ENCOUNTER — Telehealth: Payer: Self-pay

## 2019-03-21 ENCOUNTER — Encounter: Payer: Self-pay | Admitting: Obstetrics and Gynecology

## 2019-03-21 VITALS — BP 120/79 | HR 88 | Wt 140.4 lb

## 2019-03-21 DIAGNOSIS — O358XX0 Maternal care for other (suspected) fetal abnormality and damage, not applicable or unspecified: Secondary | ICD-10-CM

## 2019-03-21 DIAGNOSIS — Z3A32 32 weeks gestation of pregnancy: Secondary | ICD-10-CM | POA: Diagnosis not present

## 2019-03-21 DIAGNOSIS — Z3403 Encounter for supervision of normal first pregnancy, third trimester: Secondary | ICD-10-CM

## 2019-03-21 DIAGNOSIS — O35EXX0 Maternal care for other (suspected) fetal abnormality and damage, fetal genitourinary anomalies, not applicable or unspecified: Secondary | ICD-10-CM

## 2019-03-21 LAB — POCT URINALYSIS DIPSTICK OB
Bilirubin, UA: NEGATIVE
Blood, UA: NEGATIVE
Glucose, UA: NEGATIVE
Ketones, UA: NEGATIVE
Leukocytes, UA: NEGATIVE
Nitrite, UA: NEGATIVE
POC,PROTEIN,UA: NEGATIVE
Spec Grav, UA: 1.01 (ref 1.010–1.025)
Urobilinogen, UA: 0.2 E.U./dL
pH, UA: 7 (ref 5.0–8.0)

## 2019-03-21 NOTE — Progress Notes (Signed)
ROB: Patient notes some pelvic discomfort, likely round ligament pain. Repeat sono for renal pyelectasis, still mild on right, has increased on left. Will refer to MFM for further evaluation. RTC in 2 weeks.

## 2019-03-21 NOTE — Progress Notes (Signed)
ROB-Pt is present today for prenatal care. Pt stated that she was doing well other than having lower pelvic pain pain.

## 2019-03-21 NOTE — Telephone Encounter (Signed)
Pt called to see if she could come in earlier. Pt has an u/s at 3pm and unable to come in earlier.

## 2019-03-21 NOTE — Patient Instructions (Signed)

## 2019-03-26 ENCOUNTER — Other Ambulatory Visit: Payer: Self-pay | Admitting: Obstetrics and Gynecology

## 2019-03-26 DIAGNOSIS — O35EXX0 Maternal care for other (suspected) fetal abnormality and damage, fetal genitourinary anomalies, not applicable or unspecified: Secondary | ICD-10-CM

## 2019-03-26 DIAGNOSIS — O358XX0 Maternal care for other (suspected) fetal abnormality and damage, not applicable or unspecified: Secondary | ICD-10-CM

## 2019-04-04 ENCOUNTER — Other Ambulatory Visit: Payer: Self-pay

## 2019-04-04 ENCOUNTER — Encounter: Payer: Self-pay | Admitting: Obstetrics and Gynecology

## 2019-04-04 ENCOUNTER — Ambulatory Visit (INDEPENDENT_AMBULATORY_CARE_PROVIDER_SITE_OTHER): Payer: BC Managed Care – PPO | Admitting: Obstetrics and Gynecology

## 2019-04-04 VITALS — BP 114/74 | HR 87 | Ht 61.0 in | Wt 142.6 lb

## 2019-04-04 DIAGNOSIS — Z3403 Encounter for supervision of normal first pregnancy, third trimester: Secondary | ICD-10-CM

## 2019-04-04 LAB — POCT URINALYSIS DIPSTICK OB
Bilirubin, UA: NEGATIVE
Blood, UA: NEGATIVE
Glucose, UA: NEGATIVE
Ketones, UA: NEGATIVE
Leukocytes, UA: NEGATIVE
Nitrite, UA: NEGATIVE
Spec Grav, UA: 1.01 (ref 1.010–1.025)
Urobilinogen, UA: 0.2 E.U./dL
pH, UA: 7 (ref 5.0–8.0)

## 2019-04-04 NOTE — Progress Notes (Signed)
ROB: Patient complains of occasional abdominal itching.  No other itching. ?PUPP.  (If itching worsens and spreads consider testing for cholestasis)   GBS GC/CT next visit.

## 2019-04-04 NOTE — Progress Notes (Signed)
Patient comes in today for [place] appointment. She has a rash on her stomach that started a couple days ago with itching.

## 2019-04-05 ENCOUNTER — Ambulatory Visit
Admission: RE | Admit: 2019-04-05 | Discharge: 2019-04-05 | Disposition: A | Discharge status: Home / Self Care | Payer: BC Managed Care – PPO | Source: Ambulatory Visit | Attending: Obstetrics and Gynecology | Admitting: Obstetrics and Gynecology

## 2019-04-05 DIAGNOSIS — O358XX0 Maternal care for other (suspected) fetal abnormality and damage, not applicable or unspecified: Secondary | ICD-10-CM

## 2019-04-05 DIAGNOSIS — Z3A34 34 weeks gestation of pregnancy: Secondary | ICD-10-CM | POA: Insufficient documentation

## 2019-04-05 DIAGNOSIS — O35EXX0 Maternal care for other (suspected) fetal abnormality and damage, fetal genitourinary anomalies, not applicable or unspecified: Secondary | ICD-10-CM

## 2019-04-17 ENCOUNTER — Telehealth: Payer: Self-pay

## 2019-04-17 NOTE — Telephone Encounter (Signed)
Coronavirus (COVID-19) Are you at risk?  Are you at risk for the Coronavirus (COVID-19)?  To be considered HIGH RISK for Coronavirus (COVID-19), you have to meet the following criteria:  . Traveled to [place], [place], [place], [place] or [place]; or in the [place] to [place], [place], [place], or [place]; and have fever, cough, and shortness of breath within the last 2 weeks of travel OR . Been in close contact with a person diagnosed with COVID-19 within the last 2 weeks and have fever, cough, and shortness of breath . IF YOU DO NOT MEET THESE CRITERIA, YOU ARE CONSIDERED LOW RISK FOR COVID-19.  What to do if you are HIGH RISK for COVID-19?  . If you are having a medical emergency, call 911. . Seek medical care right away. Before you go to a doctor's office, urgent care or emergency department, call ahead and tell them about your recent travel, contact with someone diagnosed with COVID-19, and your symptoms. You should receive instructions from your physician's office regarding next steps of care.  . When you arrive at healthcare provider, tell the healthcare staff immediately you have returned from visiting [place], [place], [place], [place] or [place]; or traveled in the [place] to [place], [place], [place], or [place]; in the last two weeks or you have been in close contact with a person diagnosed with COVID-19 in the last 2 weeks.   . Tell the health care staff about your symptoms: fever, cough and shortness of breath. . After you have been seen by a medical provider, you will be either: o Tested for (COVID-19) and discharged home on quarantine except to seek medical care if symptoms worsen, and asked to  - Stay home and avoid contact with others until you get your results (4-5 days)  - Avoid travel on public transportation if possible (such as bus, train, or airplane) or o Sent to the Emergency Department by EMS for evaluation, COVID-19 testing, and possible  admission depending on your condition and test results.  What to do if you are LOW RISK for COVID-19?  Reduce your risk of any infection by using the same precautions used for avoiding the common cold or flu:  . Wash your hands often with soap and warm water for at least 20 seconds.  If soap and water are not readily available, use an alcohol-based hand sanitizer with at least 60% alcohol.  . If coughing or sneezing, cover your mouth and nose by coughing or sneezing into the elbow areas of your shirt or coat, into a tissue or into your sleeve (not your hands). . Avoid shaking hands with others and consider head nods or verbal greetings only. . Avoid touching your eyes, nose, or mouth with unwashed hands.  . Avoid close contact with people who are Richard Ritchey. . Avoid places or events with large numbers of people in one location, like concerts or sporting events. . Carefully consider travel plans you have or are making. . If you are planning any travel outside or inside the [place], visit the CDC's Travelers' Health webpage for the latest health notices. . If you have some symptoms but not all symptoms, continue to monitor at home and seek medical attention if your symptoms worsen. . If you are having a medical emergency, call 911.  04/17/19 SCREENING NEG SLS ADDITIONAL HEALTHCARE OPTIONS FOR PATIENTS  [facility] / e-Visit: [email or web address]         [facility]: [phone]    [facility]: [phone]                   [facility]: [phone]  

## 2019-04-18 ENCOUNTER — Encounter: Payer: Self-pay | Admitting: Obstetrics and Gynecology

## 2019-04-18 ENCOUNTER — Ambulatory Visit (INDEPENDENT_AMBULATORY_CARE_PROVIDER_SITE_OTHER): Payer: BC Managed Care – PPO | Admitting: Obstetrics and Gynecology

## 2019-04-18 ENCOUNTER — Other Ambulatory Visit: Payer: Self-pay

## 2019-04-18 VITALS — BP 126/81 | HR 97 | Wt 144.5 lb

## 2019-04-18 DIAGNOSIS — Z3483 Encounter for supervision of other normal pregnancy, third trimester: Secondary | ICD-10-CM

## 2019-04-18 LAB — POCT URINALYSIS DIPSTICK OB
Bilirubin, UA: NEGATIVE
Blood, UA: NEGATIVE
Glucose, UA: NEGATIVE
Ketones, UA: NEGATIVE
Leukocytes, UA: NEGATIVE
Nitrite, UA: NEGATIVE
Spec Grav, UA: 1.015 (ref 1.010–1.025)
Urobilinogen, UA: 0.2 E.U./dL
pH, UA: 7 (ref 5.0–8.0)

## 2019-04-18 NOTE — Patient Instructions (Signed)

## 2019-04-18 NOTE — Progress Notes (Signed)
ROB: Doing well. Did note some contractions yesterday after walking but did resolve. Answered questions regarding circumcision.  Discussed labor precautions. Is s/p MFM consult for fetal pyelectasis, recommend notifying pediatricians after delivery.  36 week labs done today. RTC in 1 week.

## 2019-04-18 NOTE — Progress Notes (Signed)
ROB-pt present for prenatal care and 36 weeks. Pt stated that she is doing well.

## 2019-04-20 LAB — STREP GP B NAA: Strep Gp B NAA: NEGATIVE

## 2019-04-20 LAB — GC/CHLAMYDIA PROBE AMP
Chlamydia trachomatis, NAA: NEGATIVE
Neisseria Gonorrhoeae by PCR: NEGATIVE

## 2019-04-25 ENCOUNTER — Other Ambulatory Visit: Payer: Self-pay

## 2019-04-25 ENCOUNTER — Ambulatory Visit (INDEPENDENT_AMBULATORY_CARE_PROVIDER_SITE_OTHER): Payer: BC Managed Care – PPO | Admitting: Obstetrics and Gynecology

## 2019-04-25 ENCOUNTER — Encounter: Payer: Self-pay | Admitting: Obstetrics and Gynecology

## 2019-04-25 VITALS — BP 118/77 | HR 78 | Ht 61.0 in | Wt 146.3 lb

## 2019-04-25 DIAGNOSIS — Z3483 Encounter for supervision of other normal pregnancy, third trimester: Secondary | ICD-10-CM

## 2019-04-25 LAB — POCT URINALYSIS DIPSTICK OB
Bilirubin, UA: NEGATIVE
Blood, UA: NEGATIVE
Glucose, UA: NEGATIVE
Ketones, UA: NEGATIVE
Leukocytes, UA: NEGATIVE
Nitrite, UA: NEGATIVE
POC,PROTEIN,UA: NEGATIVE
Spec Grav, UA: 1.01 (ref 1.010–1.025)
Urobilinogen, UA: 0.2 E.U./dL
pH, UA: 6.5 (ref 5.0–8.0)

## 2019-04-25 NOTE — Progress Notes (Signed)
Patient comes in today for ROB. She has no concerns today. Does not want cervix check.

## 2019-04-25 NOTE — Progress Notes (Signed)
ROB: Denies contractions.  Declines cervical check today.  Reports daily fetal movement.  No problems.  Signs and symptoms of labor discussed in detail.

## 2019-05-01 ENCOUNTER — Telehealth: Payer: Self-pay

## 2019-05-01 NOTE — Telephone Encounter (Signed)
Pt prescreened.     Coronavirus (COVID-19) Are you at risk?  Are you at risk for the Coronavirus (COVID-19)?  To be considered HIGH RISK for Coronavirus (COVID-19), you have to meet the following criteria:  . Traveled to [place], [place], [place], [place] or [place]; or in the [place] to [place], [place], [place], or [place]; and have fever, cough, and shortness of breath within the last 2 weeks of travel OR . Been in close contact with a person diagnosed with COVID-19 within the last 2 weeks and have fever, cough, and shortness of breath . IF YOU DO NOT MEET THESE CRITERIA, YOU ARE CONSIDERED LOW RISK FOR COVID-19.  What to do if you are HIGH RISK for COVID-19?  . If you are having a medical emergency, call 911. . Seek medical care right away. Before you go to a doctor's office, urgent care or emergency department, call ahead and tell them about your recent travel, contact with someone diagnosed with COVID-19, and your symptoms. You should receive instructions from your physician's office regarding next steps of care.  . When you arrive at healthcare provider, tell the healthcare staff immediately you have returned from visiting [place], [place], [place], [place] or [place]; or traveled in the [place] to [place], [place], [place], or [place]; in the last two weeks or you have been in close contact with a person diagnosed with COVID-19 in the last 2 weeks.   . Tell the health care staff about your symptoms: fever, cough and shortness of breath. . After you have been seen by a medical provider, you will be either: o Tested for (COVID-19) and discharged home on quarantine except to seek medical care if symptoms worsen, and asked to  - Stay home and avoid contact with others until you get your results (4-5 days)  - Avoid travel on public transportation if possible (such as bus, train, or airplane) or o Sent to the Emergency Department by EMS for evaluation, COVID-19  testing, and possible admission depending on your condition and test results.  What to do if you are LOW RISK for COVID-19?  Reduce your risk of any infection by using the same precautions used for avoiding the common cold or flu:  . Wash your hands often with soap and warm water for at least 20 seconds.  If soap and water are not readily available, use an alcohol-based hand sanitizer with at least 60% alcohol.  . If coughing or sneezing, cover your mouth and nose by coughing or sneezing into the elbow areas of your shirt or coat, into a tissue or into your sleeve (not your hands). . Avoid shaking hands with others and consider head nods or verbal greetings only. . Avoid touching your eyes, nose, or mouth with unwashed hands.  . Avoid close contact with people who are sick. . Avoid places or events with large numbers of people in one location, like concerts or sporting events. . Carefully consider travel plans you have or are making. . If you are planning any travel outside or inside the [place], visit the CDC's Travelers' Health webpage for the latest health notices. . If you have some symptoms but not all symptoms, continue to monitor at home and seek medical attention if your symptoms worsen. . If you are having a medical emergency, call 911.   ADDITIONAL HEALTHCARE OPTIONS FOR PATIENTS  [facility] / e-Visit: [email or web address]         [facility]   [facility]: [facility]: [phone]

## 2019-05-02 ENCOUNTER — Other Ambulatory Visit: Payer: Self-pay

## 2019-05-02 ENCOUNTER — Ambulatory Visit (INDEPENDENT_AMBULATORY_CARE_PROVIDER_SITE_OTHER): Payer: BC Managed Care – PPO | Admitting: Obstetrics and Gynecology

## 2019-05-02 ENCOUNTER — Encounter: Payer: Self-pay | Admitting: Obstetrics and Gynecology

## 2019-05-02 VITALS — BP 123/83 | HR 96 | Wt 147.9 lb

## 2019-05-02 DIAGNOSIS — Z3403 Encounter for supervision of normal first pregnancy, third trimester: Secondary | ICD-10-CM

## 2019-05-02 DIAGNOSIS — Z3A38 38 weeks gestation of pregnancy: Secondary | ICD-10-CM

## 2019-05-02 LAB — POCT URINALYSIS DIPSTICK OB
Bilirubin, UA: NEGATIVE
Blood, UA: NEGATIVE
Glucose, UA: NEGATIVE
Ketones, UA: NEGATIVE
Leukocytes, UA: NEGATIVE
Nitrite, UA: NEGATIVE
POC,PROTEIN,UA: NEGATIVE
Spec Grav, UA: 1.02 (ref 1.010–1.025)
Urobilinogen, UA: 0.2 E.U./dL
pH, UA: 6 (ref 5.0–8.0)

## 2019-05-02 NOTE — Patient Instructions (Signed)
Signs and Symptoms of Labor Labor is your body's natural process of moving your baby, placenta, and umbilical cord out of your uterus. The process of labor usually starts when your baby is full-term, between 37 and 40 weeks of pregnancy. How will I know when I am close to going into labor? As your body prepares for labor and the birth of your baby, you may notice the following symptoms in the weeks and days before true labor starts:  Having a strong desire to get your home ready to receive your new baby. This is called nesting. Nesting may be a sign that labor is approaching, and it may occur several weeks before birth. Nesting may involve cleaning and organizing your home.  Passing a small amount of thick, bloody mucus out of your vagina (normal bloody show or losing your mucus plug). This may happen more than a week before labor begins, or it might occur right before labor begins as the opening of the cervix starts to widen (dilate). For some women, the entire mucus plug passes at once. For others, smaller portions of the mucus plug may gradually pass over several days.  Your baby moving (dropping) lower in your pelvis to get into position for birth (lightening). When this happens, you may feel more pressure on your bladder and pelvic bone and less pressure on your ribs. This may make it easier to breathe. It may also cause you to need to urinate more often and have problems with bowel movements.  Having "practice contractions" (Braxton Hicks contractions) that occur at irregular (unevenly spaced) intervals that are more than 10 minutes apart. This is also called false labor. False labor contractions are common after exercise or sexual activity, and they will stop if you change position, rest, or drink fluids. These contractions are usually mild and do not get stronger over time. They may feel like: ? A backache or back pain. ? Mild cramps, similar to menstrual cramps. ? Tightening or pressure in  your abdomen. Other early symptoms that labor may be starting soon include:  Nausea or loss of appetite.  Diarrhea.  Having a sudden burst of energy, or feeling very tired.  Mood changes.  Having trouble sleeping. How will I know when labor has begun? Signs that true labor has begun may include:  Having contractions that come at regular (evenly spaced) intervals and increase in intensity. This may feel like more intense tightening or pressure in your abdomen that moves to your back. ? Contractions may also feel like rhythmic pain in your upper thighs or back that comes and goes at regular intervals. ? For first-time mothers, this change in intensity of contractions often occurs at a more gradual pace. ? Women who have given birth before may notice a more rapid progression of contraction changes.  Having a feeling of pressure in the vaginal area.  Your water breaking (rupture of membranes). This is when the sac of fluid that surrounds your baby breaks. When this happens, you will notice fluid leaking from your vagina. This may be clear or blood-tinged. Labor usually starts within 24 hours of your water breaking, but it may take longer to begin. ? Some women notice this as a gush of fluid. ? Others notice that their underwear repeatedly becomes damp. Follow these instructions at home:   When labor starts, or if your water breaks, call your health care provider or nurse care line. Based on your situation, they will determine when you should go in for an   exam.  When you are in early labor, you may be able to rest and manage symptoms at home. Some strategies to try at home include: ? Breathing and relaxation techniques. ? Taking a warm bath or shower. ? Listening to music. ? Using a heating pad on the lower back for pain. If you are directed to use heat:  Place a towel between your skin and the heat source.  Leave the heat on for 20-30 minutes.  Remove the heat if your skin turns  bright red. This is especially important if you are unable to feel pain, heat, or cold. You may have a greater risk of getting burned. Get help right away if:  You have painful, regular contractions that are 5 minutes apart or less.  Labor starts before you are [redacted] weeks along in your pregnancy.  You have a fever.  You have a headache that does not go away.  You have bright red blood coming from your vagina.  You do not feel your baby moving.  You have a sudden onset of: ? Severe headache with vision problems. ? Nausea, vomiting, or diarrhea. ? Chest pain or shortness of breath. These symptoms may be an emergency. If your health care provider recommends that you go to the hospital or birth center where you plan to deliver, do not drive yourself. Have someone else drive you, or call emergency services (911 in the [place] Summary  Labor is your body's natural process of moving your baby, placenta, and umbilical cord out of your uterus.  The process of labor usually starts when your baby is full-term, between 5 and 40 weeks of pregnancy.  When labor starts, or if your water breaks, call your health care provider or nurse care line. Based on your situation, they will determine when you should go in for an exam. This information is not intended to replace advice given to you by your health care provider. Make sure you discuss any questions you have with your health care provider. Document Released: 03/11/2017 Document Revised: 07/04/2017 Document Reviewed: 03/11/2017 Elsevier Patient Education  [place].   Pain Relief During Labor and Delivery Many things can cause pain during labor and delivery, including:  Pressure on bones and ligaments due to the baby moving through the pelvis.  Stretching of tissues due to the baby moving through the birth canal.  Muscle tension due to anxiety or nervousness.  The uterus tightening (contracting) and relaxing to help move the baby.  There are many ways to deal with the pain of labor and delivery. They include:  Taking prenatal classes. Taking these classes helps you know what to expect during your baby's birth. What you learn will increase your confidence and decrease your anxiety.  Practicing relaxation techniques or doing relaxing activities, such as: ? Focused breathing. ? Meditation. ? Visualization. ? Aroma therapy. ? Listening to your favorite music. ? Hypnosis.  Taking a warm shower or bath (hydrotherapy). This may: ? Provide comfort and relaxation. ? Lessen your perception of pain. ? Decrease the amount of pain medicine needed. ? Decrease the length of labor.  Getting a massage or counterpressure on your back.  Applying warm packs or ice packs.  Changing positions often, moving around, or using a birthing ball.  Getting: ? Pain medicine through an IV or injection into a muscle. ? Pain medicine inserted into your spinal column. ? Injections of sterile water just under the skin on your lower back (intradermal injections). ? Laughing gas (nitrous  oxide). Discuss your pain control options with your health care provider during your prenatal visits. Explore the options offered by your hospital or birth center. What kinds of medicine are available? There are two kinds of medicines that can be used to relieve pain during labor and delivery:  Analgesics. These medicines decrease pain without causing you to lose feeling or the ability to move your muscles.  Anesthetics. These medicines block feeling in the body and can decrease your ability to move freely. Both of these kinds of medicine can cause minor side effects, such as nausea, trouble concentrating, and sleepiness. They can also decrease the baby's heart rate before birth and affect the baby's breathing rate after birth. For this reason, health care providers are careful about when and how much medicine is given. What are specific medicines and  procedures that provide pain relief? Local Anesthetics Local anesthetics are used to numb a small area of the body. They may be used along with another kind of anesthetic or used to numb the nerves of the vagina, cervix, and perineum during the second stage of labor. General Anesthetics General anesthetics cause you to lose consciousness so you do not feel pain. They are usually only used for an emergency cesarean delivery. General anesthetics are given through an IV tube and a mask. Pudendal Block A pudendal block is a form of local anesthetic. It may be used to relieve the pain associated with pushing or stretching of the perineum at the time of delivery or to further numb the perineum. A pudendal block is done by injecting numbing medicine through the vaginal wall into a nerve in the pelvis. Epidural Analgesia Epidural analgesia is given through a flexible IV catheter that is inserted into the lower back. Numbing medicine is delivered continuously to the area near your spinal column nerves (epidural space). After having this type of analgesia, you may be able to move your legs but you most likely will not be able to walk. Depending on the amount of medicine given, you may lose all feeling in the lower half of your body, or you may retain some level of sensation, including the urge to push. Epidural analgesia can be used to provide pain relief for a vaginal birth. Spinal Block A spinal block is similar to epidural analgesia, but the medicine is injected into the spinal fluid instead of the epidural space. A spinal block is only given once. It starts to relieve pain quickly, but the pain relief lasts only 1-6 hours. Spinal blocks can be used for cesarean deliveries. Combined Spinal-Epidural (CSE) Block A CSE block combines the effects of a spinal block and epidural analgesia. The spinal block works quickly to block all pain. The epidural analgesia provides continuous pain relief, even after the effects  of the spinal block have worn off. This information is not intended to replace advice given to you by your health care provider. Make sure you discuss any questions you have with your health care provider. Document Released: 01/20/2009 Document Revised: 09/16/2017 Document Reviewed: 02/25/2016 Elsevier Patient Education  2020 [employer]

## 2019-05-02 NOTE — Progress Notes (Signed)
Subjective:    Allison Carney is a 33 y.o. female being seen today for her obstetrical visit. She is at [redacted]w[redacted]d gestation. Baby is moving normally- today it feels like he's headbutting her pelvis. Time of day doesn't seem to influence his movement. After her last prenatal visit, the mucous plug came disattached, but not much fluid has come out.   Menstrual History: OB History    Gravida  1   Para      Term      Preterm      AB      Living        SAB      TAB      Ectopic      Multiple      Live Births               Patient's last menstrual period was 08/04/2018 (exact date).      Review of Systems See HPI.  Objective:    BP 123/83   Pulse 96   Wt 67.1 kg   LMP 08/04/2018 (Exact Date)   BMI 27.95 kg/m  FHT: 144 BPM  Uterine Size: 37cm  Presentations: anterior  Pelvic Exam: Performed by MD             Dilation: 1cm       Effacement: 30% effaced             Station:  unsure    Consistency: unsure            Position: unsure     Assessment:   Normal 3rd trimester pregnancy [redacted]w[redacted]d; estimated date of delivery 05/10/2019. GBS, GC/chlamydia negative. She is 30% effaced, 1cm dilated, and baby is positioned head down. Discussed ways to encourage labor, including walking, intercourse, birthing ball, etc.   Plan:   Plans for delivery: Vaginal anticipated Beta strep culture: negative 04/18/2019 Counseling: L&D discussed Fetal monitoring: none Follow up in 1 week, unless baby has already been delivered.   Marin Roberts, PA-S 05/02/19

## 2019-05-02 NOTE — Progress Notes (Signed)
ROB-Pt present for prenatal care. Pt stated having pain/pressure in the lower abd/pelvic area. Pt stated no other problems.

## 2019-05-02 NOTE — Progress Notes (Signed)
ROB: Patient doing well, no complaints. States she lost part of her mucus plug ~ 2 weeks ago. Discussed s/s of labor. RTC in 1 week.

## 2019-05-09 ENCOUNTER — Encounter: Payer: Self-pay | Admitting: Obstetrics and Gynecology

## 2019-05-09 ENCOUNTER — Other Ambulatory Visit: Payer: Self-pay

## 2019-05-09 ENCOUNTER — Ambulatory Visit (INDEPENDENT_AMBULATORY_CARE_PROVIDER_SITE_OTHER): Payer: BC Managed Care – PPO | Admitting: Obstetrics and Gynecology

## 2019-05-09 VITALS — BP 125/81 | HR 103 | Ht 61.0 in | Wt 147.6 lb

## 2019-05-09 DIAGNOSIS — Z3403 Encounter for supervision of normal first pregnancy, third trimester: Secondary | ICD-10-CM

## 2019-05-09 LAB — POCT URINALYSIS DIPSTICK OB
Bilirubin, UA: NEGATIVE
Blood, UA: NEGATIVE
Glucose, UA: NEGATIVE
Ketones, UA: NEGATIVE
Leukocytes, UA: NEGATIVE
Nitrite, UA: NEGATIVE
POC,PROTEIN,UA: NEGATIVE
Spec Grav, UA: 1.01 (ref 1.010–1.025)
Urobilinogen, UA: 0.2 E.U./dL
pH, UA: 6.5 (ref 5.0–8.0)

## 2019-05-09 NOTE — Progress Notes (Signed)
Patient comes in today for ROB. She is having some cramping.

## 2019-05-09 NOTE — Addendum Note (Signed)
Addended by: Finis Bud on: 05/09/2019 11:56 AM   Modules accepted: Orders, SmartSet

## 2019-05-09 NOTE — Progress Notes (Signed)
ROB: Rare contractions.  No complaints.  Biophysical profile Friday.  COVID testing number.  Induction scheduled for midnight Tuesday into Wednesday.  Signs and symptoms of labor discussed

## 2019-05-10 ENCOUNTER — Other Ambulatory Visit: Payer: Self-pay | Admitting: Obstetrics and Gynecology

## 2019-05-10 DIAGNOSIS — O288 Other abnormal findings on antenatal screening of mother: Secondary | ICD-10-CM

## 2019-05-10 DIAGNOSIS — O480 Post-term pregnancy: Secondary | ICD-10-CM

## 2019-05-10 DIAGNOSIS — Z3403 Encounter for supervision of normal first pregnancy, third trimester: Secondary | ICD-10-CM

## 2019-05-11 ENCOUNTER — Encounter: Payer: Self-pay | Admitting: *Deleted

## 2019-05-11 ENCOUNTER — Inpatient Hospital Stay
Admission: EM | Admit: 2019-05-11 | Discharge: 2019-05-13 | DRG: 807 | Disposition: A | Discharge status: Home / Self Care | Payer: BC Managed Care – PPO | Attending: Obstetrics and Gynecology | Admitting: Obstetrics and Gynecology

## 2019-05-11 ENCOUNTER — Other Ambulatory Visit: Payer: Self-pay

## 2019-05-11 ENCOUNTER — Ambulatory Visit
Admission: RE | Admit: 2019-05-11 | Discharge: 2019-05-11 | Disposition: A | Discharge status: Home / Self Care | Payer: BC Managed Care – PPO | Source: Ambulatory Visit | Attending: Obstetrics and Gynecology | Admitting: Obstetrics and Gynecology

## 2019-05-11 DIAGNOSIS — Z3A40 40 weeks gestation of pregnancy: Secondary | ICD-10-CM | POA: Diagnosis not present

## 2019-05-11 DIAGNOSIS — O358XX0 Maternal care for other (suspected) fetal abnormality and damage, not applicable or unspecified: Secondary | ICD-10-CM | POA: Diagnosis present

## 2019-05-11 DIAGNOSIS — Z1159 Encounter for screening for other viral diseases: Secondary | ICD-10-CM | POA: Diagnosis not present

## 2019-05-11 DIAGNOSIS — O480 Post-term pregnancy: Secondary | ICD-10-CM

## 2019-05-11 DIAGNOSIS — O288 Other abnormal findings on antenatal screening of mother: Secondary | ICD-10-CM | POA: Insufficient documentation

## 2019-05-11 DIAGNOSIS — O4103X0 Oligohydramnios, third trimester, not applicable or unspecified: Secondary | ICD-10-CM | POA: Diagnosis present

## 2019-05-11 DIAGNOSIS — Z3403 Encounter for supervision of normal first pregnancy, third trimester: Secondary | ICD-10-CM | POA: Insufficient documentation

## 2019-05-11 DIAGNOSIS — O35EXX0 Maternal care for other (suspected) fetal abnormality and damage, fetal genitourinary anomalies, not applicable or unspecified: Secondary | ICD-10-CM | POA: Diagnosis present

## 2019-05-11 DIAGNOSIS — O4100X0 Oligohydramnios, unspecified trimester, not applicable or unspecified: Secondary | ICD-10-CM | POA: Diagnosis present

## 2019-05-11 LAB — CBC
HCT: 34.3 % — ABNORMAL LOW (ref 36.0–46.0)
Hemoglobin: 11.7 g/dL — ABNORMAL LOW (ref 12.0–15.0)
MCH: 30 pg (ref 26.0–34.0)
MCHC: 34.1 g/dL (ref 30.0–36.0)
MCV: 87.9 fL (ref 80.0–100.0)
Platelets: 212 10*3/uL (ref 150–400)
RBC: 3.9 MIL/uL (ref 3.87–5.11)
RDW: 12.7 % (ref 11.5–15.5)
WBC: 8.7 10*3/uL (ref 4.0–10.5)
nRBC: 0 % (ref 0.0–0.2)

## 2019-05-11 LAB — TYPE AND SCREEN
ABO/RH(D): A POS
Antibody Screen: NEGATIVE

## 2019-05-11 LAB — SARS CORONAVIRUS 2 BY RT PCR (HOSPITAL ORDER, PERFORMED IN ~~LOC~~ HOSPITAL LAB): SARS Coronavirus 2: NEGATIVE

## 2019-05-11 MED ORDER — LIDOCAINE HCL (PF) 1 % IJ SOLN
INTRAMUSCULAR | Status: AC
  Filled 2019-05-11: qty 30

## 2019-05-11 MED ORDER — LIDOCAINE HCL (PF) 1 % IJ SOLN: 30.0000 mL | INTRAMUSCULAR | Status: DC | PRN

## 2019-05-11 MED ORDER — OXYTOCIN BOLUS FROM INFUSION: 500.0000 mL | Freq: Once | INTRAVENOUS | Status: DC

## 2019-05-11 MED ORDER — OXYTOCIN 40 UNITS IN NORMAL SALINE INFUSION - SIMPLE MED: 2.5000 [IU]/h | INTRAVENOUS | Status: DC

## 2019-05-11 MED ORDER — OXYTOCIN 10 UNIT/ML IJ SOLN
INTRAMUSCULAR | Status: AC
  Filled 2019-05-11: qty 2

## 2019-05-11 MED ORDER — MISOPROSTOL 200 MCG PO TABS
ORAL_TABLET | ORAL | Status: AC
  Administered 2019-05-11: 50 ug via VAGINAL
  Filled 2019-05-11: qty 4

## 2019-05-11 MED ORDER — AMMONIA AROMATIC IN INHA
RESPIRATORY_TRACT | Status: AC
  Filled 2019-05-11: qty 10

## 2019-05-11 MED ORDER — ONDANSETRON HCL 4 MG/2ML IJ SOLN: 4.0000 mg | Freq: Four times a day (QID) | INTRAMUSCULAR | Status: DC | PRN

## 2019-05-11 MED ORDER — ACETAMINOPHEN 325 MG PO TABS: 650.0000 mg | ORAL_TABLET | ORAL | Status: DC | PRN

## 2019-05-11 MED ORDER — LACTATED RINGERS IV SOLN
INTRAVENOUS | Status: DC
  Administered 2019-05-11 – 2019-05-12 (×2): via INTRAVENOUS

## 2019-05-11 MED ORDER — BUTORPHANOL TARTRATE 1 MG/ML IJ SOLN: 1.0000 mg | INTRAMUSCULAR | Status: DC | PRN

## 2019-05-11 MED ORDER — SOD CITRATE-CITRIC ACID 500-334 MG/5ML PO SOLN: 30.0000 mL | ORAL | Status: DC | PRN

## 2019-05-11 MED ORDER — MISOPROSTOL 100 MCG PO TABS
50.0000 ug | ORAL_TABLET | ORAL | Status: DC | PRN
  Administered 2019-05-11: 23:00:00 50 ug via VAGINAL
  Filled 2019-05-11 (×3): qty 1

## 2019-05-11 MED ORDER — LACTATED RINGERS IV SOLN: 500.0000 mL | INTRAVENOUS | Status: DC | PRN

## 2019-05-11 NOTE — H&P (Signed)
Obstetric History and Physical  Allison Carney is a 33 y.o. G1P0 with IUP at 7234w1d presenting for induction of labor due to oligohydramnios in post-dates pregnancy. Patient states she has been having  none contractions, none vaginal bleeding, intact membranes, with active fetal movement.    Prenatal Course Source of Care: [facility] with onset of care at 8 weeks Pregnancy complications or risks: Patient Active Problem List   Diagnosis Date Noted  . Oligohydramnios 05/11/2019  . Post-dates pregnancy 05/11/2019  . Pyelectasis of fetus on prenatal ultrasound 01/31/2019  . Chronic migraine without aura without status migrainosus, not intractable 08/29/2018  . Nasal congestion 10/28/2016  . Elevated LDL cholesterol level 08/12/2014   She plans to breastfeed She desires oral contraceptives (estrogen/progesterone) for postpartum contraception.   Prenatal labs and studies: ABO, Rh: A/Positive/-- (12/18 1557) Antibody: Negative (12/18 1557) Rubella: 4.63 (12/18 1557) RPR: Non Reactive (05/14 0930)  HBsAg: Negative (12/18 1557)  HIV: Non Reactive (12/18 1557)  XBM:WUXLKGMWGBS:Negative (07/01 1533) 1 hr Glucola  normal Genetic screening normal Anatomy US abnormal with renal pyelectasis (mild on right, moderate on left). S/p MFM consultation   Past Medical History:  Diagnosis Date  . Migraine    approx 1x/month  . Wears contact lenses     Past Surgical History:  Procedure Laterality Date  . APPENDECTOMY    . FACIAL LACERATION REPAIR Left 10/28/2017   Procedure: LEFT EAR LOBE LACERATION REPAIR;  Surgeon: Linus SalmonsMcQueen, Chapman, MD;  [facility];  Service: ENT;  Laterality: Left;  LOCAL    OB History  Gravida Para Term Preterm AB Living  1            SAB TAB Ectopic Multiple Live Births               # Outcome Date GA Lbr Len/2nd Weight Sex Delivery Anes PTL Lv  1 Current             Social History   Socioeconomic History  . Marital status: Married    Spouse  name: Not on file  . Number of children: Not on file  . Years of education: Not on file  . Highest education level: Not on file  Occupational History  . Not on file  Social Needs  . Financial resource strain: Not on file  . Food insecurity    Worry: Not on file    Inability: Not on file  . Transportation needs    Medical: Not on file    Non-medical: Not on file  Tobacco Use  . Smoking status: Never Smoker  . Smokeless tobacco: Never Used  Substance and Sexual Activity  . Alcohol use: Not Currently  . Drug use: No  . Sexual activity: Yes    Birth control/protection: None  Lifestyle  . Physical activity    Days per week: Not on file    Minutes per session: Not on file  . Stress: Not on file  Relationships  . Social Musicianconnections    Talks on phone: Not on file    Gets together: Not on file    Attends religious service: Not on file    Active member of club or organization: Not on file    Attends meetings of clubs or organizations: Not on file    Relationship status: Not on file  Other Topics Concern  . Not on file  Social History Narrative   5th grade teacher.   Very active.   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  Family History  Problem Relation Age of Onset  . Healthy Mother   . Healthy Father   . Breast cancer Neg Hx   . Ovarian cancer Neg Hx   . Colon cancer Neg Hx     Medications Prior to Admission  Medication Sig Dispense Refill Last Dose  . montelukast (SINGULAIR) 10 MG tablet Take 10 mg by mouth at bedtime.   05/10/2019 at 2000  . Prenatal Vit-Fe Fumarate-FA (MULTIVITAMIN-PRENATAL) 27-0.8 MG TABS tablet Take 1 tablet by mouth daily at 12 noon.   05/11/2019 at 1200    No Known Allergies  Review of Systems: Negative except for what is mentioned in HPI.  Physical Exam: BP 137/85   Pulse (!) 108   Temp 98.4 F (36.9 C) (Oral)   Resp 16   Ht 5\' 1"  (1.549 m)   Wt 66.7 kg   LMP 08/04/2018 (Exact Date)   BMI 27.78 kg/m  CONSTITUTIONAL: Well-developed,  well-nourished female in no acute distress.  HENT:  Normocephalic, atraumatic, External right and left ear normal. Oropharynx is clear and moist EYES: Conjunctivae and EOM are normal. Pupils are equal, round, and reactive to light. No scleral icterus.  NECK: Normal range of motion, supple, no masses SKIN: Skin is warm and dry. No rash noted. Not diaphoretic. No erythema. No pallor. NEUROLOGIC: Alert and oriented to person, place, and time. Normal reflexes, muscle tone coordination. No cranial nerve deficit noted. PSYCHIATRIC: Normal mood and affect. Normal behavior. Normal judgment and thought content. CARDIOVASCULAR: Normal heart rate noted, regular rhythm RESPIRATORY: Effort and breath sounds normal, no problems with respiration noted ABDOMEN: Soft, nontender, nondistended, gravid. MUSCULOSKELETAL: Normal range of motion. No edema and no tenderness. 2+ distal pulses.  Cervical Exam: Dilatation 1 cm   Effacement 40%   Station -2, -3   Presentation: cephalic FHT:  Baseline rate 135 bpm   Variability moderate  Accelerations present   Decelerations none Contractions: Every 7-10 mins   Pertinent Labs/Studies:   Results for orders placed or performed during the hospital encounter of 05/11/19 (from the past 24 hour(s))  CBC     Status: Abnormal   Collection Time: 05/11/19  9:06 PM  Result Value Ref Range   WBC 8.7 4.0 - 10.5 K/uL   RBC 3.90 3.87 - 5.11 MIL/uL   Hemoglobin 11.7 (L) 12.0 - 15.0 g/dL   HCT 34.3 (L) 36.0 - 46.0 %   MCV 87.9 80.0 - 100.0 fL   MCH 30.0 26.0 - 34.0 pg   MCHC 34.1 30.0 - 36.0 g/dL   RDW 12.7 11.5 - 15.5 %   Platelets 212 150 - 400 K/uL   nRBC 0.0 0.0 - 0.2 %  Type and screen     Status: None   Collection Time: 05/11/19  9:06 PM  Result Value Ref Range   ABO/RH(D) A POS    Antibody Screen NEG    Sample Expiration      05/14/2019,2359 Performed at [facility], [address] [place], [place] [address]      Imaging:  US OB Limited CLINICAL  DATA:  Post-dates pregnancy. Current assigned gestational age of [redacted] weeks 1 day. Evaluate amniotic fluid volume and biophysical profile.  EXAM: LIMITED OBSTETRIC ULTRASOUND AND BIOPHYSICAL PROFILE  FINDINGS: Number of Fetuses: 1  Heart Rate:  131 bpm  Movement: Yes  Presentation: Cephalic  Previa: No  Placental Location: Anterior  Amniotic Fluid (Subjective): Mildly decreased  AFI 4.7 cm (5%ile= 7.1 cm, 95%= 21.4 cm for 40 wks)  BIOPHYSICAL PROFILE  Movement: 2 time: 20 minutes  Breathing: 2  Tone:  2  Amniotic Fluid: 0  Total Score:  6  IMPRESSION: Decreased amniotic fluid volume, with AFI of 4.7 cm.  Biophysical profile score is 6 out of 8.  Electronically Signed   By: Danae OrleansJohn A Stahl M.D.   On: 05/11/2019 14:06 US FETAL BPP WO NON STRESS CLINICAL DATA:  Post-dates pregnancy. Current assigned gestational age of [redacted] weeks 1 day. Evaluate amniotic fluid volume and biophysical profile.  EXAM: LIMITED OBSTETRIC ULTRASOUND AND BIOPHYSICAL PROFILE  FINDINGS: Number of Fetuses: 1  Heart Rate:  131 bpm  Movement: Yes  Presentation: Cephalic  Previa: No  Placental Location: Anterior  Amniotic Fluid (Subjective): Mildly decreased  AFI 4.7 cm (5%ile= 7.1 cm, 95%= 21.4 cm for 40 wks)  BIOPHYSICAL PROFILE  Movement: 2 time: 20 minutes  Breathing: 2  Tone:  2  Amniotic Fluid: 0  Total Score:  6  IMPRESSION: Decreased amniotic fluid volume, with AFI of 4.7 cm.  Biophysical profile score is 6 out of 8.  Electronically Signed   By: Danae OrleansJohn A Stahl M.D.   On: 05/11/2019 14:06   Assessment : Allison MinceJessica N Carney is a 33 y.o. G1P0 at 823w1d being admitted for induction of labor due to postdates pregnancy with low AFI.  Plan: Labor: Induction with Cytotec as ordered per protocol. Analgesia as needed. FWB: Reassuring fetal heart tracing.  GBS negative.  Delivery plan: Hopeful for vaginal delivery   Hildred Laserherry, Dafna Romo, MD [facility]

## 2019-05-12 DIAGNOSIS — O358XX0 Maternal care for other (suspected) fetal abnormality and damage, not applicable or unspecified: Secondary | ICD-10-CM

## 2019-05-12 MED ORDER — DIPHENHYDRAMINE HCL 25 MG PO CAPS: 25.0000 mg | ORAL_CAPSULE | Freq: Four times a day (QID) | ORAL | Status: DC | PRN

## 2019-05-12 MED ORDER — PRENATAL MULTIVITAMIN CH
1.0000 | ORAL_TABLET | Freq: Every day | ORAL | Status: DC
  Administered 2019-05-12 – 2019-05-13 (×2): 1 via ORAL
  Filled 2019-05-12 (×2): qty 1

## 2019-05-12 MED ORDER — BENZOCAINE-MENTHOL 20-0.5 % EX AERO
1.0000 "application " | INHALATION_SPRAY | CUTANEOUS | Status: DC | PRN
  Administered 2019-05-12: 1 via TOPICAL
  Filled 2019-05-12: qty 56

## 2019-05-12 MED ORDER — COCONUT OIL OIL
1.0000 "application " | TOPICAL_OIL | Status: DC | PRN
  Administered 2019-05-12: 1 via TOPICAL
  Filled 2019-05-12: qty 120

## 2019-05-12 MED ORDER — ACETAMINOPHEN 325 MG PO TABS: 650.0000 mg | ORAL_TABLET | ORAL | Status: DC | PRN

## 2019-05-12 MED ORDER — TETANUS-DIPHTH-ACELL PERTUSSIS 5-2.5-18.5 LF-MCG/0.5 IM SUSP: 0.5000 mL | Freq: Once | INTRAMUSCULAR | Status: DC

## 2019-05-12 MED ORDER — WITCH HAZEL-GLYCERIN EX PADS
1.0000 "application " | MEDICATED_PAD | CUTANEOUS | Status: DC | PRN
  Administered 2019-05-12: 1 via TOPICAL
  Filled 2019-05-12: qty 100

## 2019-05-12 MED ORDER — SENNOSIDES-DOCUSATE SODIUM 8.6-50 MG PO TABS
2.0000 | ORAL_TABLET | ORAL | Status: DC
  Administered 2019-05-12: 2 via ORAL
  Filled 2019-05-12: qty 2

## 2019-05-12 MED ORDER — DIBUCAINE (PERIANAL) 1 % EX OINT
1.0000 "application " | TOPICAL_OINTMENT | CUTANEOUS | Status: DC | PRN
  Administered 2019-05-12: 1 via RECTAL
  Filled 2019-05-12: qty 28

## 2019-05-12 MED ORDER — OXYTOCIN 40 UNITS IN NORMAL SALINE INFUSION - SIMPLE MED
1.0000 m[IU]/min | INTRAVENOUS | Status: DC
  Administered 2019-05-12: 2 m[IU]/min via INTRAVENOUS
  Filled 2019-05-12: qty 1000

## 2019-05-12 MED ORDER — SIMETHICONE 80 MG PO CHEW: 80.0000 mg | CHEWABLE_TABLET | ORAL | Status: DC | PRN

## 2019-05-12 MED ORDER — ZOLPIDEM TARTRATE 5 MG PO TABS: 5.0000 mg | ORAL_TABLET | Freq: Every evening | ORAL | Status: DC | PRN

## 2019-05-12 MED ORDER — ONDANSETRON HCL 4 MG PO TABS: 4.0000 mg | ORAL_TABLET | ORAL | Status: DC | PRN

## 2019-05-12 MED ORDER — ONDANSETRON HCL 4 MG/2ML IJ SOLN: 4.0000 mg | INTRAMUSCULAR | Status: DC | PRN

## 2019-05-12 MED ORDER — IBUPROFEN 600 MG PO TABS
600.0000 mg | ORAL_TABLET | Freq: Four times a day (QID) | ORAL | Status: DC
  Administered 2019-05-12 – 2019-05-13 (×5): 600 mg via ORAL
  Filled 2019-05-12 (×5): qty 1

## 2019-05-12 NOTE — Progress Notes (Signed)
Intrapartum Progress Note  S: Patient beginning to feel her contractions.   O: Blood pressure 137/85, pulse (!) 108, temperature 98.4 F (36.9 C), temperature source Oral, resp. rate 16, height 5\' 1"  (1.549 m), weight 66.7 kg, last menstrual period 08/04/2018. Gen App: NAD, comfortable Abdomen: soft, gravid FHT: baseline 135 bpm.  Accels present.  Decels absent. moderate in degree variability.   Tocometer: contractions q 2-3 minutes Cervix: deferred Extremities: Nontender, no edema.   Labs:    Assessment:  1: SIUP at [redacted]w[redacted]d 2. Low AFI  Plan:  1. Continue IOL with Cytotec.  Next dose due at 0300.   2. Anticipate vaginal delivery. Fetal status overall reassuring.    Rubie Maid, MD 05/12/2019 2:01 AM

## 2019-05-12 NOTE — Progress Notes (Signed)
Intrapartum Progress Note  S: Patient notes her contractions are getting stronger.   O: Blood pressure 131/79, pulse 75, temperature 98.4 F (36.9 C), temperature source Oral, resp. rate 16, height 5\' 1"  (1.549 m), weight 66.7 kg, last menstrual period 08/04/2018. Gen App: NAD, uncomfortable with contractions Abdomen: soft, gravid FHT: baseline 135 bpm.  Accels present.  Decels absent. moderate in degree variability.   Tocometer: contractions q 2-3 minutes Cervix: 3.5/60-70/-1. Possible AROM (no amniotic sac felt around fetal head), patient reports possible small amount of leakage earlier in the morning.  Extremities: Nontender, no edema.   Labs:  No new labs  Assessment:  1: SIUP at [redacted]w[redacted]d 2. Low AFI  Plan:  1. Will proceed to Pitocin augmentation. Possible SROM? At unknown time, ~ 1-2 hrs ago. Will continue to monitor.  2. Anticipate vaginal delivery. Fetal status overall reassuring.    Rubie Maid, MD 05/12/2019 5:55 AM

## 2019-05-12 NOTE — Lactation Note (Signed)
This note was copied from a baby's chart. Lactation Consultation Note  Patient Name: Allison Carney Today's Date: 05/12/2019 Reason for consult: Initial assessment;Mother's request;Primapara;1st time breastfeeding;Term;Other (Comment)(Left nipple slightly inverts when compressed)  Assisted with first breast feeding in [facility] within first hour in modified cross cradle hold on right breast skin to skin with pillow support.  Mom's right nipple everts easily, but left nipple slightly inverts with compression.  Demonstrated hand expression.  Allison Carney latches after a couple of attempts while sandwiching right breast with wide open mouth and flanged lips.  He has strong rhythmic suck and occasional swallow was audible.  He sustained the latch for 20 minutes consistently sucking with no extra stimulation needed.  Reviewed feeding cues and encouraged to put Allison Carney to the breast anytime he demonstrated hunger cues.  Reviewed newborn stomach size, supply and demand, normal course of lactation and routine newborn feeding patterns.     Maternal Data Formula Feeding for Exclusion: No Has patient been taught Hand Expression?: Yes Does the patient have breastfeeding experience prior to this delivery?: No(Gr1)  Feeding Feeding Type: Breast Fed  LATCH Score Latch: Repeated attempts needed to sustain latch, nipple held in mouth throughout feeding, stimulation needed to elicit sucking reflex.  Audible Swallowing: A few with stimulation  Type of Nipple: Everted at rest and after stimulation(Rt nipple is everted well, but lt inverts with compression )  Comfort (Breast/Nipple): Soft / non-tender  Hold (Positioning): Assistance needed to correctly position infant at breast and maintain latch.  LATCH Score: 7  Interventions Interventions: Breast feeding basics reviewed;Assisted with latch;Skin to skin;Breast massage;Hand express;Reverse pressure;Breast compression;Adjust position;Support pillows;Position  options  Lactation Tools Discussed/Used WIC Program: [employer])   Consult Status Consult Status: Follow-up Date: 05/12/19 Follow-up type: In-patient    Jarold Motto 05/12/2019, 9:55 AM

## 2019-05-13 LAB — CBC
HCT: 31.3 % — ABNORMAL LOW (ref 36.0–46.0)
Hemoglobin: 10.5 g/dL — ABNORMAL LOW (ref 12.0–15.0)
MCH: 30 pg (ref 26.0–34.0)
MCHC: 33.5 g/dL (ref 30.0–36.0)
MCV: 89.4 fL (ref 80.0–100.0)
Platelets: 184 10*3/uL (ref 150–400)
RBC: 3.5 MIL/uL — ABNORMAL LOW (ref 3.87–5.11)
RDW: 13 % (ref 11.5–15.5)
WBC: 9.5 10*3/uL (ref 4.0–10.5)
nRBC: 0 % (ref 0.0–0.2)

## 2019-05-13 MED ORDER — IBUPROFEN 800 MG PO TABS: 800.0000 mg | ORAL_TABLET | Freq: Three times a day (TID) | ORAL | 0 refills | Status: DC | PRN

## 2019-05-13 NOTE — Discharge Instructions (Signed)

## 2019-05-13 NOTE — Discharge Summary (Addendum)
OB Discharge Summary     Patient Name: Allison Carney DOB: Dec 14, 1985 MRN: [id number]  Date of admission: 05/11/2019 Delivering MD: Rubie Maid   Date of discharge: 05/13/2019  Admitting diagnosis: 40 weeks Intrauterine pregnancy: [redacted]w[redacted]d     Secondary diagnosis:  Active Problems:   Pyelectasis of fetus on prenatal ultrasound   Oligohydramnios   Post-dates pregnancy  Additional problems: None     Discharge diagnosis: Term Pregnancy Delivered                                                                                                Post partum procedures:None  Augmentation: Pitocin and Cytotec  Complications: None  Hospital course:  Induction of Labor With Vaginal Delivery   33 y.o. yo G1P0 at [redacted]w[redacted]d was admitted to the hospital 05/11/2019 for induction of labor.  Indication for induction: oligohydramnios.  Patient had an uncomplicated labor course as follows: Membrane Rupture Time/Date:  ,   Intrapartum Procedures: Episiotomy: None [1]                                         Lacerations:  None [1]  Patient had delivery of a Viable infant.  Information for the patient's newborn:  Chesney, Boy Billie [[id number]]  Delivery Method: Vag-Spont    05/12/2019  Details of delivery can be found in separate delivery note.  Patient had a routine postpartum course. Patient is discharged home 05/13/19.  Physical exam  Vitals:   05/12/19 1604 05/12/19 2018 05/12/19 2324 05/13/19 0800  BP: 125/74 125/88 109/66 110/65  Pulse: 65 84 66 70  Resp: 18 18 18 20   Temp: 97.6 F (36.4 C) 98.2 F (36.8 C) 98.1 F (36.7 C) 98.5 F (36.9 C)  TempSrc: Oral Oral Oral Oral  SpO2: 98% 99% 98% 97%  Weight:      Height:       General: alert and no distress Lochia: appropriate Uterine Fundus: firm Incision: N/A DVT Evaluation: No evidence of DVT seen on physical exam. Negative Homan's sign. No cords or calf tenderness. No significant calf/ankle edema. Labs: Lab Results  Component  Value Date   WBC 9.5 05/13/2019   HGB 10.5 (L) 05/13/2019   HCT 31.3 (L) 05/13/2019   MCV 89.4 05/13/2019   PLT 184 05/13/2019   CMP Latest Ref Rng & Units 08/16/2016  Glucose 70 - 99 mg/dL 95  BUN 6 - 23 mg/dL 11  Creatinine 0.40 - 1.20 mg/dL 0.66  Sodium 135 - 145 mEq/L 140  Potassium 3.5 - 5.1 mEq/L 3.7  Chloride 96 - 112 mEq/L 105  CO2 19 - 32 mEq/L 25  Calcium 8.4 - 10.5 mg/dL 9.2  Total Protein 6.0 - 8.3 g/dL 7.0  Total Bilirubin 0.2 - 1.2 mg/dL 0.2  Alkaline Phos 39 - 117 U/L 35(L)  AST 0 - 37 U/L 14  ALT 0 - 35 U/L 5    Discharge instruction: per After Visit Summary and "Baby and Me Booklet".  After visit meds:  Allergies as of 05/13/2019   No Known Allergies     Medication List    TAKE these medications   ibuprofen 800 MG tablet Commonly known as: ADVIL Take 1 tablet (800 mg total) by mouth every 8 (eight) hours as needed.   montelukast 10 MG tablet Commonly known as: SINGULAIR Take 10 mg by mouth at bedtime.   multivitamin-prenatal 27-0.8 MG Tabs tablet Take 1 tablet by mouth daily at 12 noon.       Diet: routine diet  Activity: Advance as tolerated. Pelvic rest for 6 weeks.   Outpatient follow up:6 weeks Follow up Appt:No future appointments. Follow up Visit:No follow-ups on file.  Postpartum contraception: Undecided  Newborn Data: Live born female  Birth Weight: 8 lb 7.8 oz (3850 g) APGAR: 8, 9  Newborn Delivery   Birth date/time: 05/12/2019 08:22:00 Delivery type: Vaginal, Spontaneous      Baby Feeding: Breast Disposition:home with mother   05/13/2019 Hildred LaserAnika Kao Conry, MD

## 2019-05-13 NOTE — Progress Notes (Signed)
Pt discharged with infant.  Discharge instructions, prescriptions and follow up appointment given to and reviewed with pt. Pt verbalized understanding. Escorted out by staff. 

## 2019-05-13 NOTE — Progress Notes (Addendum)
Post Partum Day # 1, s/p SVD  Subjective: no complaints, up ad lib, voiding and tolerating PO. Patient desires to go home today if possible.   Objective: Temp:  [97.6 F (36.4 C)-98.5 F (36.9 C)] 98.5 F (36.9 C) (07/26 0800) Pulse Rate:  [65-84] 70 (07/26 0800) Resp:  [18-20] 20 (07/26 0800) BP: (109-135)/(65-88) 110/65 (07/26 0800) SpO2:  [97 %-99 %] 97 % (07/26 0800)  Physical Exam:  General: alert and no distress  Lungs: clear to auscultation bilaterally Breasts: normal appearance, no masses or tenderness Heart: regular rate and rhythm, S1, S2 normal, no murmur, click, rub or gallop Abdomen: soft, non-tender; bowel sounds normal; no masses,  no organomegaly Pelvis: Lochia: appropriate, Uterine Fundus: firm Extremities: DVT Evaluation: No evidence of DVT seen on physical exam. Negative Homan's sign. No cords or calf tenderness. No significant calf/ankle edema.   Recent Labs    05/11/19 2106 05/13/19 0627  HGB 11.7* 10.5*  HCT 34.3* 31.3*    Assessment/Plan: Doing well postpartum Breastfeeding, Lactation consult as needed Circumcision prior to discharge  Contraception undecided. To discuss at postpartum vist.  Can d/c home today.    LOS: 2 days   Allison Maid, MD [facility] 05/13/2019 10:52 AM

## 2019-05-13 NOTE — Lactation Note (Signed)
This note was copied from a baby's chart. Lactation Consultation Note  Patient Name: Allison Carney Today's Date: 05/13/2019 Reason for consult: Follow-up assessment;Primapara;Term;Other (Comment)(Sleepy after circumcision procedure, but had great BF before)  [name] has been really sleep since circumcision.  We could get him to latch, but could not get him to take but a couple of weak sucks.  Reassured mom that this was normal after circumcision for a baby to shut the world out for a couple of feedings, but usually once the anesthetic wears off they marathon breast feed for a while.  Another reason not to be concerned is that he has only had a 1 % weight loss since birth, he is having adequate voids and stools and he cluster fed all night.  Mom's breasts feel fuller since he has been cluster feeding and we can easily hand express.  Mom breast fed him for 40 minutes just prior to taking him for his circumcision.  Mom and baby are to be discharged today.  Reviewed community lactation resources with contact numbers given and encouraged to call with any questions, concerns or assistance.  Maternal Data Formula Feeding for Exclusion: No Has patient been taught Hand Expression?: Yes Does the patient have breastfeeding experience prior to this delivery?: No(Gr1)  Feeding Feeding Type: Breast Fed  LATCH Score Latch: Too sleepy or reluctant, no latch achieved, no sucking elicited.  Audible Swallowing: None  Type of Nipple: Everted at rest and after stimulation  Comfort (Breast/Nipple): Filling, red/small blisters or bruises, mild/mod discomfort  Hold (Positioning): Assistance needed to correctly position infant at breast and maintain latch.  LATCH Score: 4  Interventions Interventions: Assisted with latch;Skin to skin;Breast massage;Hand express;Reverse pressure;Breast compression;Adjust position;Support pillows;Position options;Comfort gels  Lactation Tools Discussed/Used Tools: Comfort  gels WIC Program: No(BCBS Insurance) Pump Review: Setup, frequency, and cleaning;Milk Storage;Other (comment)   Consult Status Consult Status: PRN Follow-up type: Call as needed    Jarold Motto 05/13/2019, 12:51 PM

## 2019-05-14 LAB — RPR: RPR Ser Ql: NONREACTIVE

## 2019-06-21 NOTE — Progress Notes (Signed)
PT is present today for her postpartum visit. Pt stated that she is breastfeeding and no having any sexually intercourse recently. Pt stated that she would like to get birth control but is unsure what she wants due to birth control pills gave her a headache. . EPDS= 10.  Pt stated that she is doing well no problems.

## 2019-06-22 ENCOUNTER — Other Ambulatory Visit: Payer: Self-pay

## 2019-06-22 ENCOUNTER — Ambulatory Visit (INDEPENDENT_AMBULATORY_CARE_PROVIDER_SITE_OTHER): Payer: BC Managed Care – PPO | Admitting: Obstetrics and Gynecology

## 2019-06-22 ENCOUNTER — Encounter: Payer: Self-pay | Admitting: Obstetrics and Gynecology

## 2019-06-22 DIAGNOSIS — G43009 Migraine without aura, not intractable, without status migrainosus: Secondary | ICD-10-CM

## 2019-06-22 DIAGNOSIS — F53 Postpartum depression: Secondary | ICD-10-CM

## 2019-06-22 DIAGNOSIS — O99345 Other mental disorders complicating the puerperium: Secondary | ICD-10-CM

## 2019-06-22 DIAGNOSIS — Z30011 Encounter for initial prescription of contraceptive pills: Secondary | ICD-10-CM

## 2019-06-22 NOTE — Progress Notes (Signed)
   OBSTETRICS POSTPARTUM CLINIC PROGRESS NOTE  Subjective:     Allison Carney is a 33 y.o. G1P0 female who presents for a postpartum visit. She is 6 weeks postpartum following a spontaneous vaginal delivery. I have fully reviewed the prenatal and intrapartum course. The delivery was at 40 gestational weeks.  Anesthesia: none. Postpartum course has been well. Baby's course has been well. Baby is feeding by breast. Bleeding: patient has not resumed menses, with No LMP recorded.. Bowel function is normal. Bladder function is normal. Patient is not sexually active. Contraception method desired is undecided but thinking about OCPs but has history of migraines with her previous combined OCP. Postpartum depression screening: positive (EPDS - 10).  The following portions of the patient's history were reviewed and updated as appropriate: allergies, current medications, past family history, past medical history, past social history, past surgical history and problem list.  Review of Systems Pertinent items noted in HPI and remainder of comprehensive ROS otherwise negative.   Objective:    BP 116/77   Pulse 85   Ht 5\' 1"  (1.549 m)   Wt 126 lb 11.2 oz (57.5 kg)   Breastfeeding Yes   BMI 23.94 kg/m   General:  alert and no distress   Breasts:  inspection negative, no nipple discharge or bleeding, no masses or nodularity palpable  Lungs: clear to auscultation bilaterally  Heart:  regular rate and rhythm, S1, S2 normal, no murmur, click, rub or gallop  Abdomen: soft, non-tender; bowel sounds normal; no masses,  no organomegaly.    Vulva:  normal  Vagina: normal vagina, no discharge, exudate, lesion, or erythema  Cervix:  no cervical motion tenderness and no lesions  Corpus: normal size, contour, position, consistency, mobility, non-tender  Adnexa:  normal adnexa and no mass, fullness, tenderness  Rectal Exam: Not performed.         Labs:  Lab Results  Component Value Date   HGB 10.5 (L)  05/13/2019     Assessment:   Postpartum care following vaginal delivery Postpartum depression Encounter for initial prescription of contraceptive pills Migraine without aura and without status migrainosus, not intractable  Plan:    1. Contraception: oral progesterone-only contraceptive. Patient with history of migraines, previously was on combined OCPs with occasional migraines. Changed to progesterone-only OCP. Given sample of Slynd. To call back for prescription if desired. Advised on Sunday start and back up method of contraception for at least 1 week.  2. Postpartum depression, mild. Patient states that she thinks it is due to her level of inactivity. Notes that she used to be very physically active prior to pregnancy and delivery.  Notes that this was her method of coping.  Advised that she can return to exercising at this time. If after 1 month she does not note an  improvement in her symptoms, should return for discussion of further management including counseling and/or medications.  3. Follow up in: as needed.  Patient usually has annual care including pap smears with PCP.   To f/u sooner as needed.    Rubie Maid, MD [facility]

## 2019-06-22 NOTE — Patient Instructions (Signed)
Depresin perinatal Perinatal Depression Cuando una mujer siente tristeza, ira o ansiedad en exceso durante el embarazo o durante los primeros 5meses posteriores al parto presenta una afeccin denominada depresin perinatal. La depresin puede afectar el desempeo laboral o acadmico, las relaciones personales y Sid Falcon actividades cotidianas. Si no se controla de forma [place], tambin puede causar problemas en la madre y el beb. En ocasiones, la depresin perinatal no se trata debido a que se considera que los sntomas son cambios normales en el estado de nimo durante e inmediatamente despus del [place]. Si presenta sntomas de depresin, es importante que hable con su mdico. Cules son las causas? Se desconoce la causa exacta de esta afeccin. Es posible que los cambios hormonales durante y despus del embarazo estn relacionados con la causa de la depresin perinatal. Sander Nephew incrementa el riesgo? Es ms probable que desarrolle esta afeccin si:  Tiene antecedentes personales o familiares de depresin, ansiedad o trastornos del estado de nimo.  Sufre un suceso estresante de la vida durante el [place], como la muerte de un ser querido.  Tiene mucho estrs en la vida.  No tiene apoyo de familiares o seres queridos, o est en una relacin de [place]. Cules son los signos o sntomas? Los sntomas de esta afeccin [employer] siguientes:  Sentimientos de tristeza y desesperanza.  Sentimientos de culpa.  Sentirse irritable o abrumada.  Cambios en el apetito.  Falta de energa o motivacin.  Problemas para dormir.  Dificultad para concentrarse o completar las tareas.  Prdida de inters en los pasatiempos o las relaciones.  Dolores de [place] estomacales que no desaparecen. Cmo se diagnostica? Esta afeccin se diagnostica en funcin de un examen fsico y [place] evaluacin mental. En algunos casos, el mdico puede usar una herramienta de deteccin de la depresin.  Estas herramientas incluyen una lista de preguntas que pueden ayudar al mdico a diagnosticar la depresin. El mdico podr derivarla a un profesional de la salud mental especializado en depresin. Cmo se trata? El tratamiento de esta afeccin puede incluir lo siguiente:  Medicamentos. El mdico solo le indicar medicamentos que estn aprobados para tomar durante el embarazo y Customer service manager.  Psicoterapia con un profesional de la salud mental para ayudar a [place]ar[place] manager sus patrones de pensamiento (terapia conductual cognitiva).  Grupos de apoyo.  Estimulacin cerebral o terapias con luz.  Terapias de reduccin del estrs, como el mtodo de la conciencia [place]. Siga estas indicaciones en su casa: Estilo de vida  No consuma ningn producto que contenga nicotina o tabaco, como cigarrillos y Psychologist, sport and exercise. Si necesita ayuda para dejar de fumar, consulte al mdico.  Si est embarazada, no beba alcohol. Una vez que nazca el beb, limite el consumo de alcohol a no ms de Naval architect. Una medida equivale a 12oz (348ml) de cerveza, 5oz (178ml) de vino o 1oz (74ml) de bebidas alcohlicas de alta graduacin.  Considere la posibilidad de Chief Financial Officer en un grupo de apoyo para mams recientes. Pdale recomendaciones al mdico.  Cudese adecuadamente. Haga lo siguiente: ? Duerma lo suficiente. Si tiene problemas para dormir, hable con el mdico. ? Siga una dieta saludable. Debe incluir muchas frutas y verduras, cereales integrales y protenas magras. ? Realice ejercicios con regularidad como se lo haya indicado el mdico. Consulte al mdico qu ejercicios son seguros para usted. Instrucciones generales  [employer] de venta libre y los recetados solamente como se lo haya indicado el mdico.  Hable con su pareja o familiares acerca de sus  sentimientos durante el embarazo. Comparta cualquier inquietud o ansiedad que tenga.  Solicite ayuda para Optometrist las tareas  [employer] cuando la necesite. Pdales a los amigos y familiares que le preparen las comidas, le cuiden a los nios o la ayuden con la limpieza.  Concurra a todas las visitas de control como se lo haya indicado el mdico. Esto es importante. Comunquese con un mdico si:  Usted (o las Archivist) observa que presenta algn sntoma de depresin.  Tiene depresin y los sntomas empeoran.  Presenta efectos secundarios de los medicamentos, como nuseas o problemas para dormir. Solicite ayuda de inmediato si:  Siente deseos de Sport and exercise psychologist al beb o a Nurse, children's. Si alguna vez siente que puede lastimarse o Market researcher a los dems, o tiene pensamientos de poner fin a su vida, busque ayuda de inmediato. Puede dirigirse al servicio de urgencias ms cercano o comunicarse con:  El servicio de Therapist, sports (911 en los [place]).  Una lnea de asistencia al suicida y Freight forwarder en crisis, como la [employer] de Prevencin del Suicidio (National Suicide Prevention Lifeline) al [phone]. Est disponible las 24 horas del da. Resumen  La depresin perinatal se produce cuando una mujer siente tristeza, ira o ansiedad en exceso durante el embarazo o durante los primeros 50meses posteriores al parto.  Si la depresin perinatal no se trata, puede provocar problemas en la salud de la madre y del beb.  Esta afeccin se trata con medicamentos, psicoterapia, terapia de reduccin del estrs o una combinacin de dos o ms tratamientos.  Hable con su pareja o familiares acerca de sus sentimientos. No tenga miedo de pedir ayuda. Esta informacin no tiene Marine scientist el consejo del mdico. Asegrese de hacerle al mdico cualquier pregunta que tenga. Document Released: 06/29/2017 Document Revised: 06/29/2017 Document Reviewed: 06/29/2017 Elsevier Patient Education  2020 [employer].

## 2019-07-17 ENCOUNTER — Ambulatory Visit (INDEPENDENT_AMBULATORY_CARE_PROVIDER_SITE_OTHER): Payer: BC Managed Care – PPO

## 2019-07-17 DIAGNOSIS — Z23 Encounter for immunization: Secondary | ICD-10-CM | POA: Diagnosis not present

## 2019-08-20 ENCOUNTER — Telehealth: Payer: Self-pay

## 2019-08-20 MED ORDER — SLYND 4 MG PO TABS: 4.0000 mg | ORAL_TABLET | Freq: Every day | ORAL | 11 refills | Status: DC

## 2019-08-20 NOTE — Telephone Encounter (Signed)
Pt was advise to come by the office to pick up sample just in case the medication was not covered. Pt stated that she would.

## 2019-08-20 NOTE — Telephone Encounter (Signed)
Spoke with pt and she is aware that a prescription for Slynd has been sent to her pharmacy. Pt is also aware that she may have to do a prior auth due to some insurance will not cover the medication.

## 2019-08-20 NOTE — Telephone Encounter (Signed)
Pt called states BC ran out Saturday.  Please prescribe to [facility] [address]

## 2019-12-15 ENCOUNTER — Other Ambulatory Visit: Payer: Self-pay

## 2019-12-15 ENCOUNTER — Ambulatory Visit: Payer: BC Managed Care – PPO | Attending: Internal Medicine

## 2019-12-15 DIAGNOSIS — Z23 Encounter for immunization: Secondary | ICD-10-CM

## 2019-12-15 NOTE — Progress Notes (Signed)
   Covid-19 Vaccination Clinic  Name:  Allison Carney    MRN: [id number] DOB: [birth year]  12/15/2019  [name] was observed post Covid-19 immunization for 15 minutes without incidence. She was provided with Vaccine Information Sheet and instruction to access the V-Safe system.   [name] was instructed to call 911 with any severe reactions post vaccine: Marland Kitchen Difficulty breathing  . Swelling of your face and throat  . A fast heartbeat  . A bad rash all over your body  . Dizziness and weakness    Immunizations Administered    Name Date Dose VIS Date Route   Moderna COVID-19 Vaccine 12/15/2019  1:37 PM 0.5 mL 09/18/2019 Intramuscular   Manufacturer: Moderna   Lot: [id number]   NDC: 09200-415-93

## 2020-01-12 ENCOUNTER — Ambulatory Visit: Payer: BC Managed Care – PPO | Attending: Internal Medicine

## 2020-01-12 DIAGNOSIS — Z23 Encounter for immunization: Secondary | ICD-10-CM

## 2020-01-12 NOTE — Progress Notes (Signed)
   Covid-19 Vaccination Clinic  Name:  Allison Carney    MRN: [id number] DOB: [birth year]  01/12/2020  [name] was observed post Covid-19 immunization for 15 minutes without incident. She was provided with Vaccine Information Sheet and instruction to access the V-Safe system.   [name] was instructed to call 911 with any severe reactions post vaccine: Marland Kitchen Difficulty breathing  . Swelling of face and throat  . A fast heartbeat  . A bad rash all over body  . Dizziness and weakness   Immunizations Administered    Name Date Dose VIS Date Route   Moderna COVID-19 Vaccine 01/12/2020 11:05 AM 0.5 mL 09/18/2019 Intramuscular   Manufacturer: Gala Murdoch   Lot: [id number]   NDC: 08676-195-09

## 2020-04-25 ENCOUNTER — Telehealth: Payer: Self-pay

## 2020-04-25 NOTE — Telephone Encounter (Signed)
Spoke to pt concerning her birth control. Pt was informed that the office was not aware that she needed a PA for her birth control. Pt was informed that the pharmacy would be contacted and asked for a PA for the pt. Pt was advised to stop by the office this evening and pick up samples of Slynd until her PA is completed.

## 2020-04-25 NOTE — Telephone Encounter (Signed)
Patient called in stating her mother was going to come in and pick up her birth control samples. Patients name is Allison Carney DOB 12/12/1959

## 2020-07-23 ENCOUNTER — Ambulatory Visit (INDEPENDENT_AMBULATORY_CARE_PROVIDER_SITE_OTHER): Payer: BC Managed Care – PPO

## 2020-07-23 DIAGNOSIS — Z23 Encounter for immunization: Secondary | ICD-10-CM

## 2020-08-19 ENCOUNTER — Ambulatory Visit: Payer: BC Managed Care – PPO | Admitting: Obstetrics and Gynecology

## 2020-08-19 ENCOUNTER — Encounter: Payer: Self-pay | Admitting: Obstetrics and Gynecology

## 2020-08-19 ENCOUNTER — Other Ambulatory Visit: Payer: Self-pay

## 2020-08-19 VITALS — BP 119/83 | HR 76 | Ht 61.0 in | Wt 113.7 lb

## 2020-08-19 DIAGNOSIS — F439 Reaction to severe stress, unspecified: Secondary | ICD-10-CM

## 2020-08-19 DIAGNOSIS — N921 Excessive and frequent menstruation with irregular cycle: Secondary | ICD-10-CM | POA: Diagnosis not present

## 2020-08-19 NOTE — Patient Instructions (Signed)
Abnormal Uterine Bleeding °Abnormal uterine bleeding means bleeding more than usual from your uterus. It can include: °· Bleeding between periods. °· Bleeding after sex. °· Bleeding that is heavier than normal. °· Periods that last longer than usual. °· Bleeding after you have stopped having your period (menopause). °There are many problems that may cause this. You should see a doctor for any kind of bleeding that is not normal. Treatment depends on the cause of the bleeding. °Follow these instructions at home: °· Watch your condition for any changes. °· Do not use tampons, douche, or have sex, if your doctor tells you not to. °· Change your pads often. °· Get regular well-woman exams. Make sure they include a pelvic exam and cervical cancer screening. °· Keep all follow-up visits as told by your doctor. This is important. °Contact a doctor if: °· The bleeding lasts more than one week. °· You feel dizzy at times. °· You feel like you are going to throw up (nauseous). °· You throw up. °Get help right away if: °· You pass out. °· You have to change pads every hour. °· You have belly (abdominal) pain. °· You have a fever. °· You get sweaty. °· You get weak. °· You passing large blood clots from your vagina. °Summary °· Abnormal uterine bleeding means bleeding more than usual from your uterus. °· There are many problems that may cause this. You should see a doctor for any kind of bleeding that is not normal. °· Treatment depends on the cause of the bleeding. °This information is not intended to replace advice given to you by your health care provider. Make sure you discuss any questions you have with your health care provider. °Document Revised: 09/28/2016 Document Reviewed: 09/28/2016 °Elsevier Patient Education © 2020 [employer] ° °

## 2020-08-19 NOTE — Progress Notes (Signed)
° ° °  GYNECOLOGY PROGRESS NOTE  Subjective:    Patient ID: Allison Carney, female    DOB: [birth year], 34 y.o.   MRN: [id number]  HPI  Patient is a 34 y.o. G1P0 female who presents for complaints of irregular bleeding on OCPs. She has been on Slynd for the past year.  Notes that after she stopped breastfeeding ~ 6 months ago, she began experiencing 2 periods a month until this past month where she has only had one cycle. Reports taking a home pregnancy test when her cycles changed and was negative.  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 Has been trying to exercise to reduce her stress levels. Unsure if it is the birth control or the stress that is causing her cycles to be abnormal.   The following portions of the patient's history were reviewed and updated as appropriate: allergies, current medications, past family history, past medical history, past social history, past surgical history and problem list.  Review of Systems Pertinent items noted in HPI and remainder of comprehensive ROS otherwise negative.   Objective:   Blood pressure 119/83, pulse 76, height 5\' 1"  (1.549 m), weight 113 lb 11.2 oz (51.6 kg), last menstrual period 08/14/2020, currently breastfeeding. General appearance: alert Remainder of exam deferred.   Assessment:   Breakthrough bleeding on OCPs Stress at home  Plan:   1. Breakthrough bleeding on OCPs - unclear if due to progesterone OCPs after breastfeeding discontinued, or if stress levels are causing irregular cycles. Discussed option of trying a different birth control (including Micronor, combined OCPs, or other contraceptives).  Patient notes h/o migraines with combined OCPs, although has never tried lowest dose of combine hormones Lo-Loestrin.  Patient ok to try this for 1 month. If migraines return, can consider returning to progesterone-only methods.  2. Discussed stress relieving measures. Continue to encourage exercising.   Patient to notify by Mychart I 1 month if prescription for Lo-Loestrin desired, or if alternative method is needed.    A total of 15 minutes were spent face-to-face with the patient during this encounter and over half of that time dealt with counseling and coordination of care.   08/16/2020, MD [facility]

## 2020-08-19 NOTE — Progress Notes (Signed)
Pt present due to having irregular cycles since starting Slynd. Pt started having 2 cycles in one month.

## 2020-09-25 ENCOUNTER — Other Ambulatory Visit: Payer: Self-pay

## 2020-09-25 MED ORDER — NORETHIN-ETH ESTRAD-FE BIPHAS 1 MG-10 MCG / 10 MCG PO TABS: 1.0000 | ORAL_TABLET | Freq: Every day | ORAL | 11 refills | Status: DC

## 2020-11-05 ENCOUNTER — Telehealth (INDEPENDENT_AMBULATORY_CARE_PROVIDER_SITE_OTHER): Payer: BC Managed Care – PPO | Admitting: Family Medicine

## 2020-11-05 ENCOUNTER — Encounter: Payer: Self-pay | Admitting: Family Medicine

## 2020-11-05 ENCOUNTER — Other Ambulatory Visit: Payer: Self-pay

## 2020-11-05 DIAGNOSIS — G43109 Migraine with aura, not intractable, without status migrainosus: Secondary | ICD-10-CM

## 2020-11-05 MED ORDER — SUMATRIPTAN SUCCINATE 100 MG PO TABS: 100.0000 mg | ORAL_TABLET | ORAL | 11 refills | Status: DC | PRN

## 2020-11-05 MED ORDER — ONDANSETRON HCL 8 MG PO TABS: 8.0000 mg | ORAL_TABLET | Freq: Three times a day (TID) | ORAL | 1 refills | Status: DC | PRN

## 2020-11-05 NOTE — Progress Notes (Signed)
Virtual Visit via Video Note  I connected with Allison Carney on 11/05/20 at 11:00 AM EST by a video enabled telemedicine application and verified that I am speaking with the correct person using two identifiers.  Location: Patient: home Provider: office   I discussed the limitations of evaluation and management by telemedicine and the availability of in person appointments. The patient expressed understanding and agreed to proceed.  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)   history of migraine (none when pregnant or breastfeeding) and now they are back   Aura-vision blurry (10 minutes or so) and then she gets bad head pain / sometimes nausea  Starts one sided Then both sides It throbs  sens to light/sound  She cannot function-has to lie in a dark room   Getting headaches 1-2 times per month Other triggers -perhaps weather , stress  She is regular about sleeping /keeps a routine  She does exercise   Caffeine - has 1/2 caff coffee-1 cup daily   (no soda) , occ tea (decaf)     In the past imitrex 100 mg   Takes naproxen-not helping   Hist of elevated LDL cholesterol in the past Lab Results  Component Value Date   CHOL 212 (H) 08/16/2016   HDL 69.10 08/16/2016   LDLCALC 124 (H) 08/16/2016   LDLDIRECT 154.1 04/16/2013   TRIG 94.0 08/16/2016   CHOLHDL 3 08/16/2016   covid status- vaccinated moderna in feb/march  Was anemic in the past with pregnancy Lab Results  Component Value Date   WBC 9.5 05/13/2019   HGB 10.5 (L) 05/13/2019   HCT 31.3 (L) 05/13/2019   MCV 89.4 05/13/2019   PLT 184 05/13/2019     Pap 1/20 negative (when  pregnant)  Sees gyn -Dr Valentino Saxon  On OC (lo loestrin fe)   (had breakthrough bleeding on prog only pill)  Prior OC caused headache  Too early to tell     Tdap 5/20 Flu shot 10/21 HIV screen 12/19   covid imm with booster  (will call us with booster date)    Patient Active Problem List   Diagnosis Date Noted  . Oligohydramnios 05/11/2019  . Post-dates pregnancy 05/11/2019  . Pyelectasis of fetus on prenatal ultrasound 01/31/2019  . Chronic migraine without aura without status migrainosus, not intractable 08/29/2018  . Nasal congestion 10/28/2016  . Elevated LDL cholesterol level 08/12/2014   Past Medical History:  Diagnosis Date  . Migraine    approx 1x/month  . Wears contact lenses    Past Surgical History:  Procedure Laterality Date  . APPENDECTOMY    . FACIAL LACERATION REPAIR Left 10/28/2017   Procedure: LEFT EAR LOBE LACERATION REPAIR;  Surgeon: Linus Salmons, MD;  Location: [facility];  Service: ENT;  Laterality: Left;  LOCAL   Social History   Tobacco Use  . Smoking status: Never Smoker  . Smokeless tobacco: Never Used  Vaping Use  . Vaping Use: Never used  Substance Use Topics  . Alcohol use: Not Currently  . Drug use: No   Family History  Problem Relation Age of Onset  .  Healthy Mother   . Healthy Father   . Leukemia Son        at 52 1/2 y old   . Breast cancer Neg Hx   . Ovarian cancer Neg Hx   . Colon cancer Neg Hx    No Known Allergies Current Outpatient Medications on File Prior to Visit  Medication Sig Dispense Refill  . Azelastine-Fluticasone 137-50 MCG/ACT SUSP Place 1 spray into the nose in the morning and at bedtime.    Marland Kitchen levocetirizine (XYZAL) 5 MG tablet Take 1 tablet by mouth daily as needed.    . montelukast (SINGULAIR) 10 MG tablet Take 1 tablet by mouth at bedtime.    . Multiple Vitamins-Minerals (WOMENS MULTI PO) Take by mouth.    . Norethindrone-Ethinyl Estradiol-Fe Biphas (LO LOESTRIN FE) 1 MG-10 MCG / 10 MCG tablet  Take 1 tablet by mouth daily. 28 tablet 11   No current facility-administered medications on file prior to visit.   Review of Systems  Constitutional: Negative for chills, fever and malaise/fatigue.  HENT: Negative for congestion, ear pain, sinus pain and sore throat.   Eyes: Negative for blurred vision, discharge and redness.  Respiratory: Negative for cough, shortness of breath and stridor.   Cardiovascular: Negative for chest pain, palpitations and leg swelling.  Gastrointestinal: Negative for abdominal pain, diarrhea, nausea and vomiting.       Occ nausea with migraine  Musculoskeletal: Negative for myalgias.  Skin: Negative for rash.  Neurological: Positive for headaches. Negative for dizziness.  Psychiatric/Behavioral:       Stressor- son diagnosed with leukemia     Observations/Objective: Patient appears well, in no distress Weight is baseline  No facial swelling or asymmetry Normal voice-not hoarse and no slurred speech No obvious tremor or mobility impairment Moving neck and UEs normally Able to hear the call well  No cough or shortness of breath during interview  Talkative and mentally sharp with no cognitive changes No skin changes on face or neck , no rash or pallor Affect is normal    Assessment and Plan: Problem List Items Addressed This Visit      Cardiovascular and Mediastinum   Migraine with aura - Primary    Gone when pregnant and breastfeeding and now back  1-2 per mo  Severe with vomiting at times Visual aura  Px imitrex 100 mg to take prn (worked for her before) Also zofran for nausea prn Disc lifestyle habits to prevent migraine (she has a good routine) and avoid excessive caffeine Lot of stress (child with leukemia) inst to update if this does not help  If more frequent in the future would consider prophylaxis She will stay on current OC and update gyn provider       Relevant Medications   SUMAtriptan (IMITREX) 100 MG tablet       Follow Up  Instructions: Message [place] with your covid booster date when you can   Try the imitrex 100 mg with next headache (if nausea can take zofran)  Drink lots of fluids and get regular sleep  Avoid excessive caffeine   Take care of yourself  Do keep your gyn provider up to date with the contraception issues   Let us know if you need anything    I discussed the assessment and treatment plan with the patient. The patient was provided an opportunity to ask questions and all were answered. The patient agreed with the plan and demonstrated an understanding of the instructions.   The patient was  advised to call back or seek an in-person evaluation if the symptoms worsen or if the condition fails to improve as anticipated.     Loura Pardon, MD

## 2020-11-05 NOTE — Assessment & Plan Note (Signed)
Gone when pregnant and breastfeeding and now back  1-2 per mo  Severe with vomiting at times Visual aura  Px imitrex 100 mg to take prn (worked for her before) Also zofran for nausea prn Disc lifestyle habits to prevent migraine (she has a good routine) and avoid excessive caffeine Lot of stress (child with leukemia) inst to update if this does not help  If more frequent in the future would consider prophylaxis She will stay on current OC and update gyn provider

## 2020-11-05 NOTE — Patient Instructions (Addendum)
Message [place] with your covid booster date when you can   Try the imitrex 100 mg with next headache (if nausea can take zofran)  Drink lots of fluids and get regular sleep  Avoid excessive caffeine   Take care of yourself  Do keep your gyn provider up to date with the contraception issues   Let us know if you need anything

## 2021-06-09 ENCOUNTER — Encounter: Payer: BC Managed Care – PPO | Admitting: Family Medicine

## 2021-06-25 ENCOUNTER — Ambulatory Visit (INDEPENDENT_AMBULATORY_CARE_PROVIDER_SITE_OTHER): Payer: BC Managed Care – PPO | Admitting: Family Medicine

## 2021-06-25 ENCOUNTER — Encounter: Payer: Self-pay | Admitting: Family Medicine

## 2021-06-25 ENCOUNTER — Other Ambulatory Visit: Payer: Self-pay

## 2021-06-25 VITALS — BP 126/64 | HR 84 | Temp 98.6°F | Ht 61.0 in | Wt 110.3 lb

## 2021-06-25 DIAGNOSIS — Z23 Encounter for immunization: Secondary | ICD-10-CM | POA: Diagnosis not present

## 2021-06-25 DIAGNOSIS — E7800 Pure hypercholesterolemia, unspecified: Secondary | ICD-10-CM

## 2021-06-25 DIAGNOSIS — Z0000 Encounter for general adult medical examination without abnormal findings: Secondary | ICD-10-CM

## 2021-06-25 LAB — COMPREHENSIVE METABOLIC PANEL
ALT: 9 U/L (ref 0–35)
AST: 19 U/L (ref 0–37)
Albumin: 4.6 g/dL (ref 3.5–5.2)
Alkaline Phosphatase: 26 U/L — ABNORMAL LOW (ref 39–117)
BUN: 16 mg/dL (ref 6–23)
CO2: 27 mEq/L (ref 19–32)
Calcium: 9.4 mg/dL (ref 8.4–10.5)
Chloride: 104 mEq/L (ref 96–112)
Creatinine, Ser: 0.69 mg/dL (ref 0.40–1.20)
GFR: 113.47 mL/min (ref >60.00)
Glucose, Bld: 79 mg/dL (ref 70–99)
Potassium: 4.2 mEq/L (ref 3.5–5.1)
Sodium: 139 mEq/L (ref 135–145)
Total Bilirubin: 0.4 mg/dL (ref 0.2–1.2)
Total Protein: 7.1 g/dL (ref 6.0–8.3)

## 2021-06-25 LAB — CBC WITH DIFFERENTIAL/PLATELET
Basophils Absolute: 0 10*3/uL (ref 0.0–0.1)
Basophils Relative: 0.7 % (ref 0.0–3.0)
Eosinophils Absolute: 0 10*3/uL (ref 0.0–0.7)
Eosinophils Relative: 0.5 % (ref 0.0–5.0)
HCT: 42.1 % (ref 36.0–46.0)
Hemoglobin: 14 g/dL (ref 12.0–15.0)
Lymphocytes Relative: 30.8 % (ref 12.0–46.0)
Lymphs Abs: 1.6 10*3/uL (ref 0.7–4.0)
MCHC: 33.1 g/dL (ref 30.0–36.0)
MCV: 88.6 fl (ref 78.0–100.0)
Monocytes Absolute: 0.4 10*3/uL (ref 0.1–1.0)
Monocytes Relative: 6.9 % (ref 3.0–12.0)
Neutro Abs: 3.2 10*3/uL (ref 1.4–7.7)
Neutrophils Relative %: 61.1 % (ref 43.0–77.0)
Platelets: 251 10*3/uL (ref 150.0–400.0)
RBC: 4.75 Mil/uL (ref 3.87–5.11)
RDW: 12.7 % (ref 11.5–15.5)
WBC: 5.2 10*3/uL (ref 4.0–10.5)

## 2021-06-25 LAB — LIPID PANEL
Cholesterol: 203 mg/dL — ABNORMAL HIGH (ref 0–200)
HDL: 64.6 mg/dL (ref >39.00)
LDL Cholesterol: 121 mg/dL — ABNORMAL HIGH (ref 0–99)
NonHDL: 138.07
Total CHOL/HDL Ratio: 3
Triglycerides: 85 mg/dL (ref 0.0–149.0)
VLDL: 17 mg/dL (ref 0.0–40.0)

## 2021-06-25 LAB — TSH: TSH: 0.97 u[IU]/mL (ref 0.35–5.50)

## 2021-06-25 NOTE — Patient Instructions (Addendum)
Reach out to your oncology department re: available counselor for mental health who has a knowledge base re: cancer care   If that does not work out - give [place] a call   Follow up with GYN to discuss periods  Unsure if an IUD or depo provera shot would be an option later on   Labs today  Flu shot today   Take care of yourself and your family

## 2021-06-25 NOTE — Assessment & Plan Note (Signed)
Lipids drawn today  Diet is fairly good

## 2021-06-25 NOTE — Assessment & Plan Note (Signed)
Reviewed health habits including diet and exercise and skin cancer prevention Reviewed appropriate screening tests for age  Also reviewed health mt list, fam hx and immunization status , as well as social and family history   See HPI Labs ordered Discussed coping and stressors /PHQ reviewed Flu shot given today  covid vaccinated Pap utd and gyn visit upcoming Encouraged self breast exams

## 2021-06-25 NOTE — Progress Notes (Signed)
Subjective:    Patient ID: Allison Carney, female    DOB: 1986/05/13, 35 y.o.   MRN: [id number]  This visit occurred during the SARS-CoV-2 public health emergency.  Safety protocols were in place, including screening questions prior to the visit, additional usage of staff PPE, and extensive cleaning of exam room while observing appropriate contact time as indicated for disinfecting solutions.   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   Self care-makes an effort   Son had a bone marrow transplant (leukemia)  Is 2 y old  Got covid as well - but doing better   Flu shot- today   Tdap 5/20 Covid vaccinated   Pap 1/20  normal  Sees obgyn  Dr Valentino Saxon  Takes OC lo loesterin fe 1/10 Menses - not normal, was trying to fix that , she was skipping months and irregular  With migraines,staying low  Periods are coming earlier  On this pill for a long time  Was on progesterone only prior to that   Self breast exam- no lumps   PHQ/mood  She was seeing a counselor, but they left the practice  May look at the oncology dept for specialized counselors  Exercises for coping    Son with leukemia  Past h/o elevated LDL cholesterol Lab Results  Component Value Date   CHOL 212 (H) 08/16/2016   HDL 69.10 08/16/2016   LDLCALC 124 (H) 08/16/2016   LDLDIRECT 154.1 04/16/2013   TRIG 94.0 08/16/2016   CHOLHDL 3 08/16/2016   Tries to eat healthy  No fried foods  No etoh   Fam h/o lung cancer in [place]  Patient Active Problem List   Diagnosis Date Noted   Routine general medical examination at a health care facility 06/25/2021   Oligohydramnios 05/11/2019   Post-dates pregnancy  05/11/2019   Pyelectasis of fetus on prenatal ultrasound 01/31/2019   Migraine with aura 08/29/2018   Elevated LDL cholesterol level 08/12/2014   Past Medical History:  Diagnosis Date   Migraine    approx 1x/month   Wears contact lenses    Past Surgical History:  Procedure Laterality Date   APPENDECTOMY     FACIAL LACERATION REPAIR Left 10/28/2017   Procedure: LEFT EAR LOBE LACERATION REPAIR;  Surgeon: Linus Salmons, MD;  Location: [facility];  Service: ENT;  Laterality: Left;  LOCAL   Social History   Tobacco Use   Smoking status: Never   Smokeless tobacco: Never  Vaping Use   Vaping Use: Never used  Substance Use Topics   Alcohol use: Not Currently   Drug use: No   Family History  Problem Relation Age of Onset   Healthy Mother    Healthy Father    Leukemia Son        at 45 1/2 y old    Breast cancer Neg Hx    Ovarian cancer Neg Hx    Colon cancer Neg Hx    No Known Allergies Current Outpatient Medications on File Prior to Visit  Medication Sig Dispense Refill   Azelastine-Fluticasone 137-50 MCG/ACT SUSP Place  1 spray into the nose in the morning and at bedtime.     levocetirizine (XYZAL) 5 MG tablet Take 1 tablet by mouth daily as needed.     montelukast (SINGULAIR) 10 MG tablet Take 1 tablet by mouth at bedtime.     Multiple Vitamins-Minerals (WOMENS MULTI PO) Take by mouth.     Norethindrone-Ethinyl Estradiol-Fe Biphas (LO LOESTRIN FE) 1 MG-10 MCG / 10 MCG tablet Take 1 tablet by mouth daily. 28 tablet 11   ondansetron (ZOFRAN) 8 MG tablet Take 1 tablet (8 mg total) by mouth every 8 (eight) hours as needed for nausea or vomiting. With migraine 15 tablet 1   SUMAtriptan (IMITREX) 100 MG tablet Take 1 tablet (100 mg total) by mouth every 2 (two) hours as needed for migraine. May repeat in 2 hours if headache persists or recurs. 10 tablet 11   No current facility-administered medications on file prior to visit.     Review of Systems  Constitutional:   Positive for fatigue. Negative for activity change, appetite change, fever and unexpected weight change.  HENT:  Negative for congestion, ear pain, rhinorrhea, sinus pressure and sore throat.   Eyes:  Negative for pain, redness and visual disturbance.  Respiratory:  Negative for cough, shortness of breath and wheezing.   Cardiovascular:  Negative for chest pain and palpitations.  Gastrointestinal:  Negative for abdominal pain, blood in stool, constipation and diarrhea.  Endocrine: Negative for polydipsia and polyuria.  Genitourinary:  Negative for dysuria, frequency and urgency.  Musculoskeletal:  Negative for arthralgias, back pain and myalgias.  Skin:  Negative for pallor and rash.  Allergic/Immunologic: Negative for environmental allergies.  Neurological:  Negative for dizziness, syncope and headaches.  Hematological:  Negative for adenopathy. Does not bruise/bleed easily.  Psychiatric/Behavioral:  Negative for decreased concentration and dysphoric mood. The patient is nervous/anxious.       Objective:   Physical Exam Constitutional:      General: She is not in acute distress.    Appearance: Normal appearance. She is well-developed and normal weight. She is not ill-appearing or diaphoretic.  HENT:     Head: Normocephalic and atraumatic.     Right Ear: Tympanic membrane, ear canal and external ear normal.     Left Ear: Tympanic membrane, ear canal and external ear normal.     Nose: Nose normal. No congestion.     Mouth/Throat:     Mouth: Mucous membranes are moist.     Pharynx: Oropharynx is clear. No posterior oropharyngeal erythema.  Eyes:     General: No scleral icterus.    Extraocular Movements: Extraocular movements intact.     Conjunctiva/sclera: Conjunctivae normal.     Pupils: Pupils are equal, round, and reactive to light.  Neck:     Thyroid: No thyromegaly.     Vascular: No carotid bruit or JVD.  Cardiovascular:     Rate and Rhythm: Normal rate and regular rhythm.      Pulses: Normal pulses.     Heart sounds: Normal heart sounds.    No gallop.  Pulmonary:     Effort: Pulmonary effort is normal. No respiratory distress.     Breath sounds: Normal breath sounds. No wheezing.     Comments: Good air exch Chest:     Chest wall: No tenderness.  Abdominal:     General: Bowel sounds are normal. There is no distension or abdominal bruit.     Palpations: Abdomen is soft. There is no mass.  Tenderness: There is no abdominal tenderness.     Hernia: No hernia is present.  Genitourinary:    Comments: Breast exam: No mass, nodules, thickening, tenderness, bulging, retraction, inflamation, nipple discharge or skin changes noted.  No axillary or clavicular LA.     Musculoskeletal:        General: No tenderness. Normal range of motion.     Cervical back: Normal range of motion and neck supple. No rigidity. No muscular tenderness.     Right lower leg: No edema.     Left lower leg: No edema.  Lymphadenopathy:     Cervical: No cervical adenopathy.  Skin:    General: Skin is warm and dry.     Coloration: Skin is not pale.     Findings: No erythema or rash.     Comments: Solar lentigines diffusely   Neurological:     Mental Status: She is alert. Mental status is at baseline.     Cranial Nerves: No cranial nerve deficit.     Motor: No abnormal muscle tone.     Coordination: Coordination normal.     Gait: Gait normal.     Deep Tendon Reflexes: Reflexes are normal and symmetric. Reflexes normal.  Psychiatric:        Mood and Affect: Mood normal.        Cognition and Memory: Cognition and memory normal.          Assessment & Plan:   Problem List Items Addressed This Visit       Other   Elevated LDL cholesterol level    Lipids drawn today  Diet is fairly good      Routine general medical examination at a health care facility - Primary    Reviewed health habits including diet and exercise and skin cancer prevention Reviewed appropriate screening  tests for age  Also reviewed health mt list, fam hx and immunization status , as well as social and family history   See HPI Labs ordered Discussed coping and stressors /PHQ reviewed Flu shot given today  covid vaccinated Pap utd and gyn visit upcoming Encouraged self breast exams      Relevant Orders   CBC with Differential/Platelet (Completed)   Comprehensive metabolic panel (Completed)   Lipid panel (Completed)   TSH (Completed)   Flu Vaccine QUAD 6+ mos PF IM (Fluarix Quad PF) (Completed)   Other Visit Diagnoses     Need for influenza vaccination       Relevant Orders   Flu Vaccine QUAD 6+ mos PF IM (Fluarix Quad PF) (Completed)

## 2021-07-02 ENCOUNTER — Other Ambulatory Visit: Payer: Self-pay | Admitting: Obstetrics and Gynecology

## 2021-07-02 NOTE — Telephone Encounter (Signed)
Patient needs annual exam prior to next refill.  

## 2021-07-07 IMAGING — US ULTRAOUND FETAL BPP W/O NONSTRESS
1 series · 11 of 11 positions shown · non-contrast
Comparison: none

CLINICAL DATA: Post-dates pregnancy. Current assigned gestational
age of 40 weeks 1 day. Evaluate amniotic fluid volume and
biophysical profile.

EXAM:
LIMITED OBSTETRIC ULTRASOUND AND BIOPHYSICAL PROFILE

[Series 1: ultraound fetal bpp w/o nonstress · 11 acquisitions, 11 frames shown]
[im 1/11]
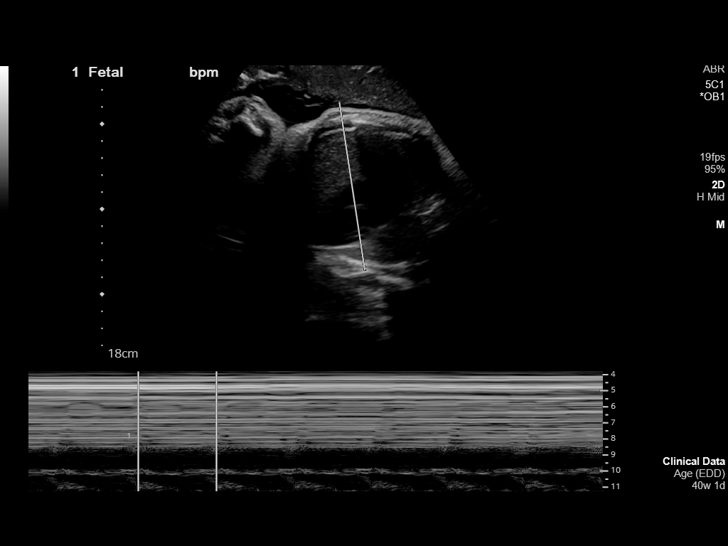
[im 2/11]
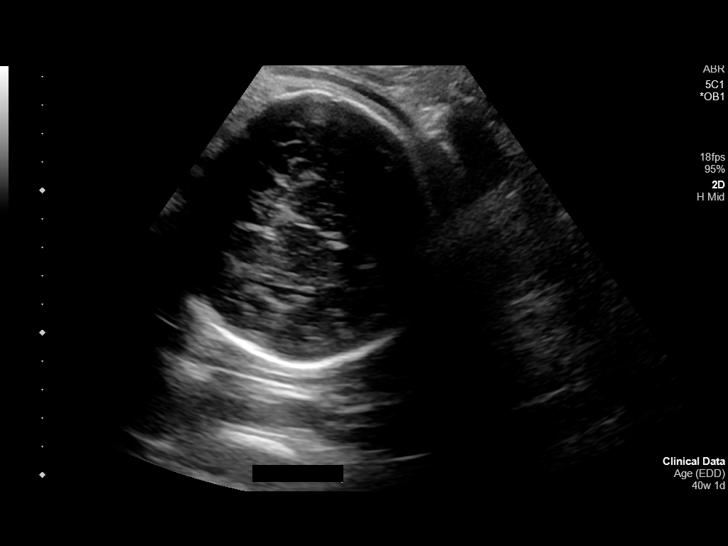
[im 3/11]
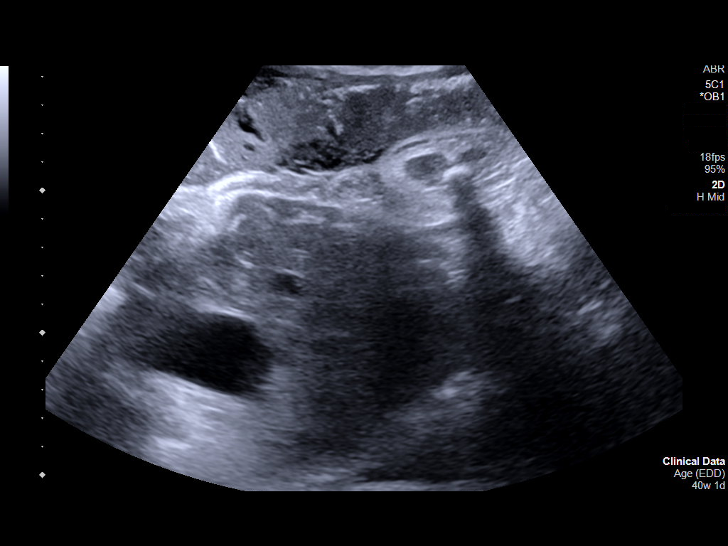
[im 4/11]
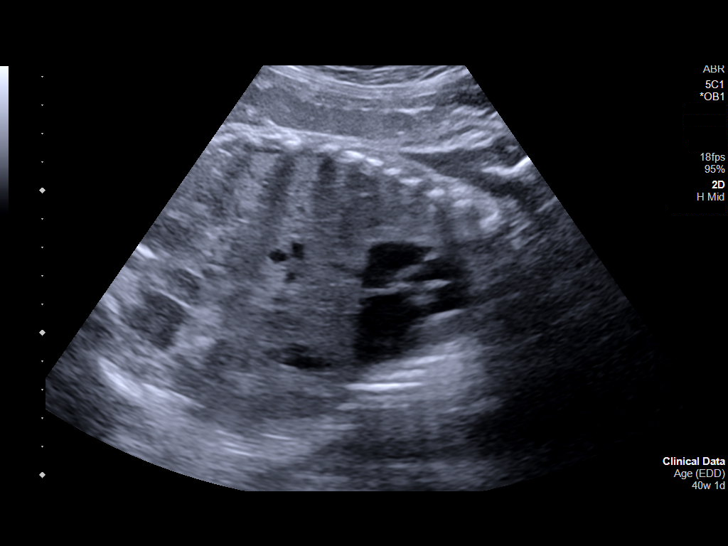
[im 5/11]
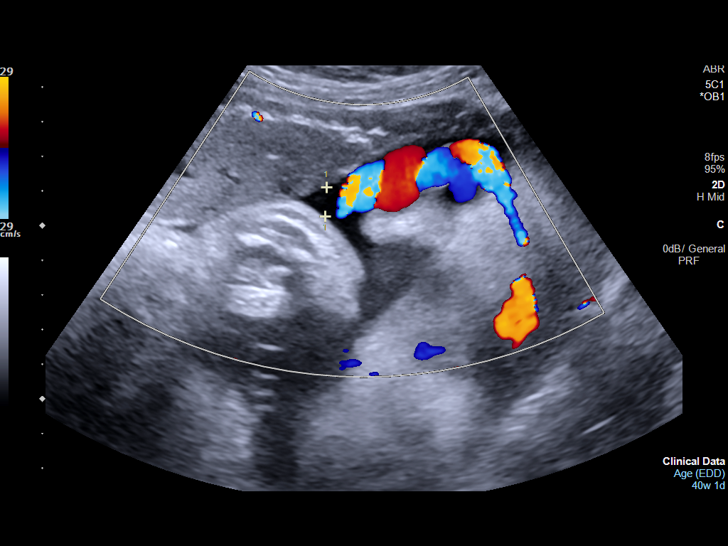
[im 6/11]
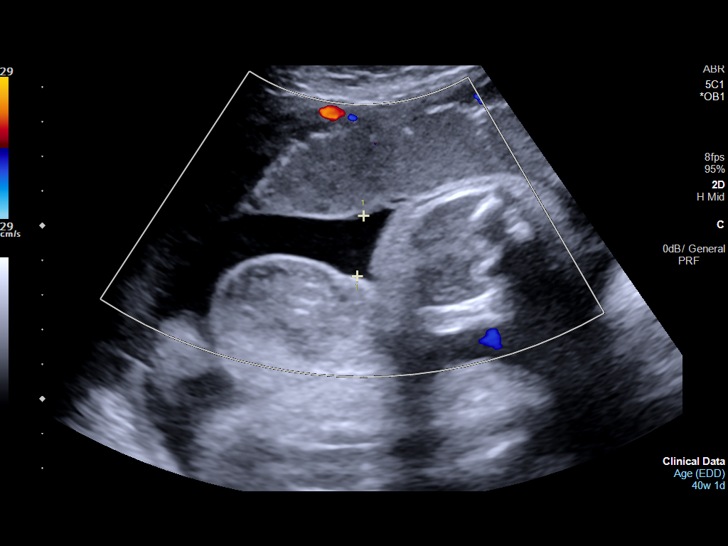
[im 7/11]
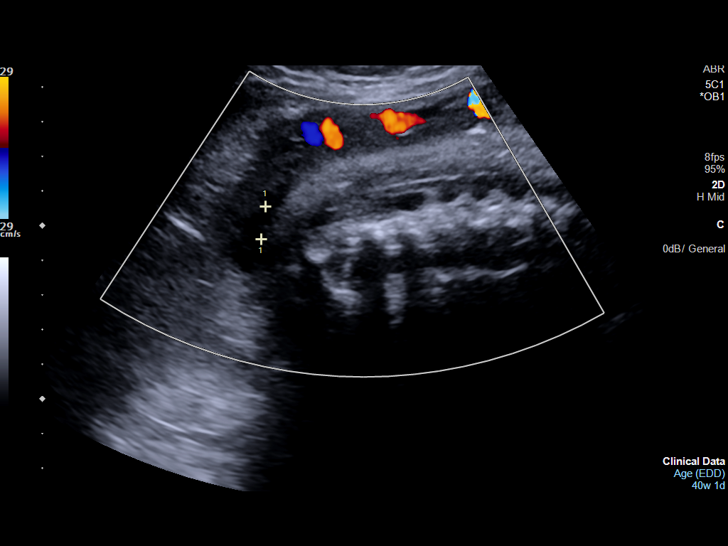
[im 8/11]
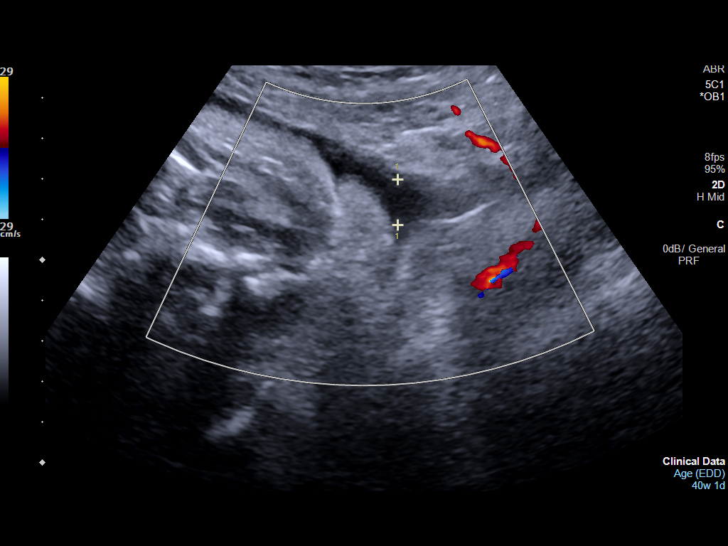
[im 9/11]
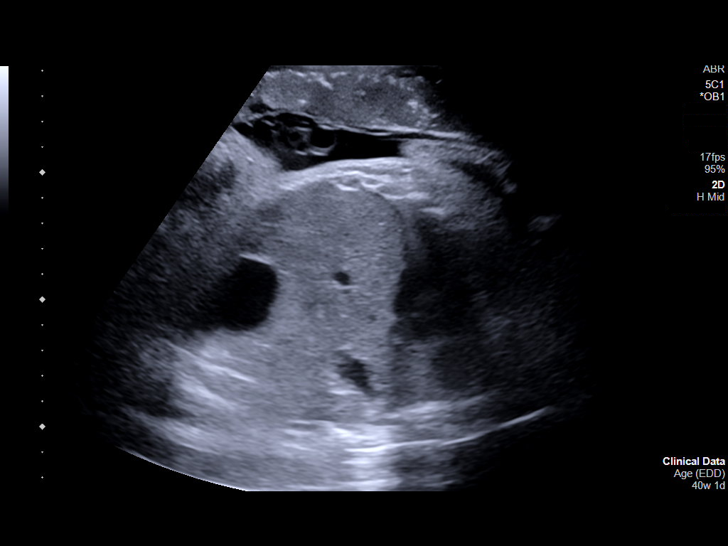
[im 10/11]
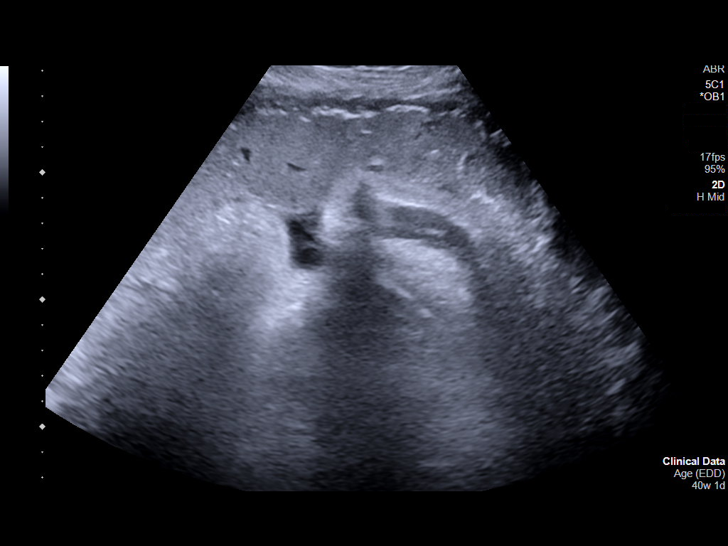
[im 11/11]
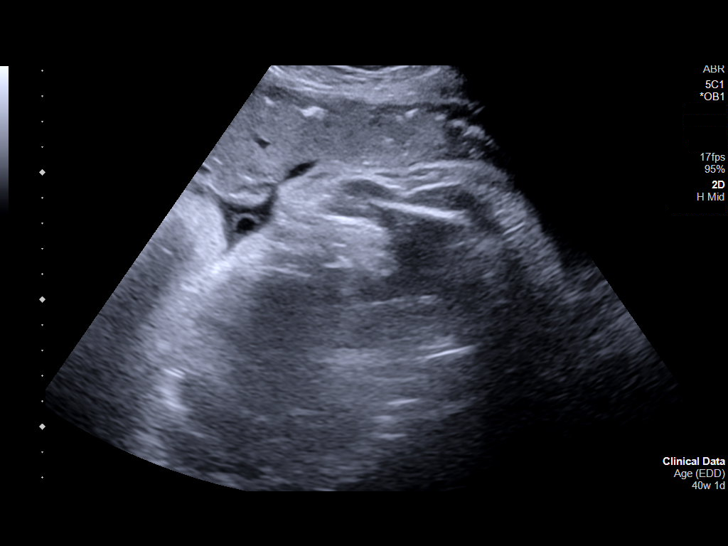

[11 of 11 positions shown; findings below may reference images not displayed]

FINDINGS: Number of Fetuses: 1

Heart Rate:  131 bpm

Movement: Yes

Presentation: Cephalic

Previa: No

Placental Location: Anterior

Amniotic Fluid (Subjective): Mildly decreased

AFI 4.7 cm (5%ile= 7.1 cm, 95%= 21.4 cm for 40 wks)

BIOPHYSICAL PROFILE

Movement: 2 time: 20 minutes

Breathing: 2

Tone:  2

Amniotic Fluid: 0

Total Score:  6
IMPRESSION: Decreased amniotic fluid volume, with AFI of 4.7 cm.

Biophysical profile score is [DATE].

## 2021-07-21 ENCOUNTER — Encounter: Payer: Self-pay | Admitting: Family Medicine

## 2021-07-22 ENCOUNTER — Ambulatory Visit (INDEPENDENT_AMBULATORY_CARE_PROVIDER_SITE_OTHER)
Admission: RE | Admit: 2021-07-22 | Discharge: 2021-07-22 | Disposition: A | Discharge status: Home / Self Care | Payer: BC Managed Care – PPO | Source: Ambulatory Visit | Attending: Primary Care | Admitting: Primary Care

## 2021-07-22 ENCOUNTER — Ambulatory Visit: Payer: BC Managed Care – PPO | Admitting: Primary Care

## 2021-07-22 ENCOUNTER — Other Ambulatory Visit: Payer: Self-pay

## 2021-07-22 ENCOUNTER — Encounter: Payer: Self-pay | Admitting: Primary Care

## 2021-07-22 ENCOUNTER — Ambulatory Visit: Payer: BC Managed Care – PPO | Admitting: Nurse Practitioner

## 2021-07-22 DIAGNOSIS — M79671 Pain in right foot: Secondary | ICD-10-CM | POA: Diagnosis not present

## 2021-07-22 NOTE — Patient Instructions (Signed)
Complete xray(s) prior to leaving today. I will notify you of your results once received.  Start Ibuprofen 400-600 mg every 8 hours as needed for pain and swelling.  Supportive shoes and socks are important.   I will be in touch once I have your xray results.  It was a pleasure meeting you!

## 2021-07-22 NOTE — Assessment & Plan Note (Signed)
Post traumatic.  Site of bruising is concerning. Need to rule out metatarsal fracture.   Checking xray today.  Discussed conservative measures such as supportive shoes, ice, NSAID treatment. She cannot buddy tape due to pain and location of injury.   Await results.

## 2021-07-22 NOTE — Progress Notes (Signed)
Subjective:    Patient ID: Allison Carney, female    DOB: December 31, 1985, 35 y.o.   MRN: [id number]  HPI  Allison Carney is a very pleasant 35 y.o. female patient of Dr. Milinda Antis who presents today to discuss toe pain.  Her pain is located to the right 5th digit which began after hitting her bench dining room table while playing with her children.  She's noticed swelling, bruising, redness. She's taken Ibuprofen and icing her toe with some improvement.  Most of her pain is located to the plantar and lateral 5th digits. She's been wearing tennis shoes to work, works as a Runner, broadcasting/film/video.   She attempted to buddy tape the toe but this caused increased pain and was unbearable.     Review of Systems  Musculoskeletal:  Positive for arthralgias.  Skin:  Positive for color change.        Past Medical History:  Diagnosis Date   Migraine    approx 1x/month   Wears contact lenses     Social History   Socioeconomic History   Marital status: Married    Spouse name: Not on file   Number of children: Not on file   Years of education: Not on file   Highest education level: Not on file  Occupational History   Not on file  Tobacco Use   Smoking status: Never   Smokeless tobacco: Never  Vaping Use   Vaping Use: Never used  Substance and Sexual Activity   Alcohol use: Not Currently   Drug use: No   Sexual activity: Yes    Birth control/protection: Pill  Other Topics Concern   Not on file  Social History Narrative   5th grade teacher.   Very active.   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   Social Determinants of Health   Financial Resource Strain: Not on file  Food Insecurity: Not on file  Transportation Needs: Not on file  Physical Activity: Not on file  Stress: Not on file  Social Connections: Not on file  Intimate Partner Violence: Not on file    Past Surgical History:  Procedure Laterality Date   APPENDECTOMY     FACIAL LACERATION REPAIR Left 10/28/2017   Procedure: LEFT EAR LOBE  LACERATION REPAIR;  Surgeon: Linus Salmons, MD;  Location: [facility];  Service: ENT;  Laterality: Left;  LOCAL    Family History  Problem Relation Age of Onset   Healthy Mother    Healthy Father    Leukemia Son        at 62 1/2 y old    Breast cancer Neg Hx    Ovarian cancer Neg Hx    Colon cancer Neg Hx     No Known Allergies  Current Outpatient Medications on File Prior to Visit  Medication Sig Dispense Refill   Azelastine-Fluticasone 137-50 MCG/ACT SUSP Place 1 spray into the nose in the morning and at bedtime.     levocetirizine (XYZAL) 5 MG tablet Take 1 tablet by mouth daily as needed.     LO LOESTRIN FE 1 MG-10 MCG / 10 MCG tablet TAKE 1 TABLET BY MOUTH EVERY DAY 84 tablet 0   montelukast (SINGULAIR) 10 MG tablet Take 1 tablet by mouth at bedtime.     Multiple Vitamins-Minerals (WOMENS MULTI PO) Take by mouth.     ondansetron (ZOFRAN) 8 MG tablet Take 1 tablet (8 mg total) by mouth every 8 (eight) hours as needed for nausea or vomiting. With migraine 15 tablet 1  SUMAtriptan (IMITREX) 100 MG tablet Take 1 tablet (100 mg total) by mouth every 2 (two) hours as needed for migraine. May repeat in 2 hours if headache persists or recurs. 10 tablet 11   No current facility-administered medications on file prior to visit.    BP 122/62   Pulse 76   Temp 98.6 F (37 C) (Temporal)   Ht 5\' 1"  (1.549 m)   Wt 112 lb (50.8 kg)   SpO2 99%   BMI 21.16 kg/m  Objective:   Physical Exam Musculoskeletal:     Comments: Erythema and mild swelling noted to right 5th toe. Bruising noted to base of 5th digit. No metatarsal tenderness. Decrease in ROM to right 5th toe due to pain and swelling. No open wounds.  Skin:    General: Skin is warm and dry.     Findings: Bruising and erythema present.  Neurological:     Mental Status: She is alert.          Assessment & Plan:      This visit occurred during the SARS-CoV-2 public health emergency.  Safety protocols were in  place, including screening questions prior to the visit, additional usage of staff PPE, and extensive cleaning of exam room while observing appropriate contact time as indicated for disinfecting solutions.

## 2021-07-24 NOTE — Telephone Encounter (Signed)
Sent letter and mychart message to patient.

## 2021-07-29 ENCOUNTER — Ambulatory Visit
Admission: EM | Admit: 2021-07-29 | Discharge: 2021-07-29 | Disposition: A | Discharge status: Home / Self Care | Payer: BC Managed Care – PPO | Attending: Emergency Medicine | Admitting: Emergency Medicine

## 2021-07-29 ENCOUNTER — Other Ambulatory Visit: Payer: Self-pay

## 2021-07-29 DIAGNOSIS — J029 Acute pharyngitis, unspecified: Secondary | ICD-10-CM | POA: Diagnosis not present

## 2021-07-29 DIAGNOSIS — Z1152 Encounter for screening for COVID-19: Secondary | ICD-10-CM | POA: Diagnosis not present

## 2021-07-29 DIAGNOSIS — B349 Viral infection, unspecified: Secondary | ICD-10-CM

## 2021-07-29 LAB — POCT RAPID STREP A (OFFICE): Rapid Strep A Screen: NEGATIVE

## 2021-07-29 NOTE — Discharge Instructions (Addendum)
Your rapid strep test is negative.  Your COVID and Flu tests are pending.  You should self quarantine until the test results are back.   ° °Take Tylenol or ibuprofen as needed for fever or discomfort.  Rest and keep yourself hydrated.   ° °Follow-up with your primary care provider if your symptoms are not improving.   ° ° °

## 2021-07-29 NOTE — ED Provider Notes (Signed)
Allison Carney    CSN: [id number] Arrival date & time: 07/29/21  1623      History   Chief Complaint Chief Complaint  Patient presents with   Sore Throat    Post nasal drip since today     HPI Allison Carney is a 35 y.o. female.  Patient presents with fever, earache, sore throat, postnasal drip since this morning.  Treatment at home with ibuprofen; none since early morning.  She denies rash, cough, shortness of breath, vomiting, diarrhea, or other symptoms.  The history is provided by the patient and medical records.   Past Medical History:  Diagnosis Date   Migraine    approx 1x/month   Wears contact lenses     Patient Active Problem List   Diagnosis Date Noted   Acute foot pain, right 07/22/2021   Routine general medical examination at a health care facility 06/25/2021   Oligohydramnios 05/11/2019   Post-dates pregnancy 05/11/2019   Pyelectasis of fetus on prenatal ultrasound 01/31/2019   Migraine with aura 08/29/2018   Elevated LDL cholesterol level 08/12/2014    Past Surgical History:  Procedure Laterality Date   APPENDECTOMY     FACIAL LACERATION REPAIR Left 10/28/2017   Procedure: LEFT EAR LOBE LACERATION REPAIR;  Surgeon: Linus Salmons, MD;  Location: [facility];  Service: ENT;  Laterality: Left;  LOCAL    OB History     Gravida  1   Para      Term      Preterm      AB      Living         SAB      IAB      Ectopic      Multiple      Live Births               Home Medications    Prior to Admission medications   Medication Sig Start Date End Date Taking? Authorizing Provider  Azelastine-Fluticasone 137-50 MCG/ACT SUSP Place 1 spray into the nose in the morning and at bedtime. 10/09/20   [provider]  levocetirizine (XYZAL) 5 MG tablet Take 1 tablet by mouth daily as needed. 10/09/20   [provider]  LO LOESTRIN FE 1 MG-10 MCG / 10 MCG tablet TAKE 1 TABLET BY MOUTH EVERY DAY 07/02/21    Hildred Laser, MD  montelukast (SINGULAIR) 10 MG tablet Take 1 tablet by mouth at bedtime. 10/09/20   [provider]  Multiple Vitamins-Minerals (WOMENS MULTI PO) Take by mouth.    [provider]  ondansetron (ZOFRAN) 8 MG tablet Take 1 tablet (8 mg total) by mouth every 8 (eight) hours as needed for nausea or vomiting. With migraine 11/05/20   Tower, Audrie Gallus, MD  SUMAtriptan (IMITREX) 100 MG tablet Take 1 tablet (100 mg total) by mouth every 2 (two) hours as needed for migraine. May repeat in 2 hours if headache persists or recurs. 11/05/20   Tower, Audrie Gallus, MD    Family History Family History  Problem Relation Age of Onset   Healthy Mother    Healthy Father    Leukemia Son        at 67 1/2 y old    Breast cancer Neg Hx    Ovarian cancer Neg Hx    Colon cancer Neg Hx     Social History Social History   Tobacco Use   Smoking status: Never   Smokeless tobacco: Never  Vaping  Use   Vaping Use: Never used  Substance Use Topics   Alcohol use: Not Currently   Drug use: No     Allergies   Patient has no known allergies.   Review of Systems Review of Systems  Constitutional:  Negative for chills and fever.  HENT:  Positive for ear pain, postnasal drip and sore throat.   Respiratory:  Negative for cough and shortness of breath.   Cardiovascular:  Negative for chest pain and palpitations.  Gastrointestinal:  Negative for abdominal pain, diarrhea and vomiting.  Skin:  Negative for color change and rash.  All other systems reviewed and are negative.   Physical Exam Triage Vital Signs ED Triage Vitals  Enc Vitals Group     BP 07/29/21 1653 134/68     Pulse Rate 07/29/21 1653 100     Resp 07/29/21 1653 18     Temp 07/29/21 1653 (!) 100.9 F (38.3 C)     Temp Source 07/29/21 1653 Oral     SpO2 07/29/21 1653 97 %     Weight --      Height --      Head Circumference --      Peak Flow --      Pain Score 07/29/21 1659 7     Pain Loc --      Pain Edu? --       Excl. in GC? --    No data found.  Updated Vital Signs BP 134/68 (BP Location: Left Arm)   Pulse 100   Temp (!) 100.9 F (38.3 C) (Oral)   Resp 18   LMP 07/27/2021   SpO2 97%   Visual Acuity Right Eye Distance:   Left Eye Distance:   Bilateral Distance:    Right Eye Near:   Left Eye Near:    Bilateral Near:     Physical Exam Vitals and nursing note reviewed.  Constitutional:      General: She is not in acute distress.    Appearance: She is well-developed.  HENT:     Head: Normocephalic and atraumatic.     Right Ear: Tympanic membrane normal.     Left Ear: Tympanic membrane normal.     Nose: Nose normal.     Mouth/Throat:     Mouth: Mucous membranes are moist.     Pharynx: Posterior oropharyngeal erythema present.  Eyes:     Conjunctiva/sclera: Conjunctivae normal.  Cardiovascular:     Rate and Rhythm: Normal rate and regular rhythm.     Heart sounds: Normal heart sounds.  Pulmonary:     Effort: Pulmonary effort is normal. No respiratory distress.     Breath sounds: Normal breath sounds.  Abdominal:     Palpations: Abdomen is soft.     Tenderness: There is no abdominal tenderness.  Musculoskeletal:     Cervical back: Neck supple.  Skin:    General: Skin is warm and dry.  Neurological:     General: No focal deficit present.     Mental Status: She is alert and oriented to person, place, and time.     Gait: Gait normal.  Psychiatric:        Mood and Affect: Mood normal.        Behavior: Behavior normal.     UC Treatments / Results  Labs (all labs ordered are listed, but only abnormal results are displayed) Labs Reviewed  COVID-19, FLU A+B NAA  POCT RAPID STREP A (OFFICE)    EKG  Radiology No results found.  Procedures Procedures (including critical care time)  Medications Ordered in UC Medications - No data to display  Initial Impression / Assessment and Plan / UC Course  I have reviewed the triage vital signs and the nursing  notes.  Pertinent labs & imaging results that were available during my care of the patient were reviewed by me and considered in my medical decision making (see chart for details).    Viral illness, sore throat.  Rapid strep negative. COVID and Flu pending.  Instructed patient to self quarantine per CDC guidelines.  Discussed symptomatic treatment including Tylenol or ibuprofen, rest, hydration.  Instructed patient to follow up with PCP if symptoms are not improving.  Patient agrees to plan of care.   Final Clinical Impressions(s) / UC Diagnoses   Final diagnoses:  Viral illness  Sore throat     Discharge Instructions      Your rapid strep test is negative.    Your COVID and Flu tests are pending.  You should self quarantine until the test results are back.    Take Tylenol or ibuprofen as needed for fever or discomfort.  Rest and keep yourself hydrated.    Follow-up with your primary care provider if your symptoms are not improving.           ED Prescriptions   None    PDMP not reviewed this encounter.   Mickie Bail, NP 07/29/21 [phone]

## 2021-07-29 NOTE — ED Triage Notes (Addendum)
Patient presents to Urgent Care with complaints of sore throat, bilateral ear pain, and post nasal drip since today. Not treating symptoms. Has a hx of allergies.

## 2021-07-31 LAB — COVID-19, FLU A+B NAA
Influenza A, NAA: NOT DETECTED
Influenza B, NAA: NOT DETECTED
SARS-CoV-2, NAA: NOT DETECTED

## 2021-08-20 ENCOUNTER — Ambulatory Visit: Payer: BC Managed Care – PPO | Admitting: Obstetrics and Gynecology

## 2021-08-20 ENCOUNTER — Other Ambulatory Visit: Payer: Self-pay

## 2021-08-20 ENCOUNTER — Encounter: Payer: Self-pay | Admitting: Obstetrics and Gynecology

## 2021-08-20 VITALS — BP 108/72 | HR 62 | Ht 61.0 in | Wt 113.4 lb

## 2021-08-20 DIAGNOSIS — Z3041 Encounter for surveillance of contraceptive pills: Secondary | ICD-10-CM | POA: Diagnosis not present

## 2021-08-20 MED ORDER — LO LOESTRIN FE 1 MG-10 MCG / 10 MCG PO TABS: 1.0000 | ORAL_TABLET | Freq: Every day | ORAL | 3 refills | Status: DC

## 2021-08-20 NOTE — Progress Notes (Signed)
GYNECOLOGY PROGRESS NOTE  Subjective:    Patient ID: Allison Carney, female    DOB: October 27, 1985, 35 y.o.   MRN: [id number]  HPI  Patient is a 35 y.o. G1P0 female who presents for birth control consult. Patient is currently taking Lo Loestrin FE. Thinks that the breakthrough bleeding that she was having was stress related to her son being diagnosed with leukemia last year. 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  Last pap smear was 10/31/2018, and was normal. Has seen PCP in September for her physical.      Upstream - 08/20/21 1553       Pregnancy Intention Screening   Does the patient want to become pregnant in the next year? No    Does the patient's partner want to become pregnant in the next year? No    Would the patient like to discuss contraceptive options today? No      Contraception Wrap Up   Current Method Oral Contraceptive            The pregnancy intention screening data noted above was reviewed. Potential methods of contraception were discussed. The patient elected to proceed with No data recorded.   The following portions of the patient's history were reviewed and updated as appropriate:   She  has a past medical history of Migraine and Wears contact lenses.  She  has a past surgical history that includes Appendectomy and Facial laceration repair (Left, 10/28/2017).  Her family history includes Healthy in her father and mother; Leukemia in her son.  She  reports that she has never smoked. She has never used smokeless tobacco. She reports that she does not currently use alcohol. She reports that she does not use drugs.  Current Outpatient Medications on File Prior to Visit  Medication Sig Dispense Refill   Azelastine-Fluticasone 137-50 MCG/ACT SUSP Place 1 spray into the nose in the morning and at bedtime.     levocetirizine (XYZAL) 5 MG tablet Take 1 tablet by mouth daily as needed.     LO LOESTRIN FE 1 MG-10 MCG / 10 MCG tablet TAKE 1 TABLET BY MOUTH  EVERY DAY 84 tablet 0   montelukast (SINGULAIR) 10 MG tablet Take 1 tablet by mouth at bedtime.     Multiple Vitamins-Minerals (WOMENS MULTI PO) Take by mouth.     ondansetron (ZOFRAN) 8 MG tablet Take 1 tablet (8 mg total) by mouth every 8 (eight) hours as needed for nausea or vomiting. With migraine 15 tablet 1   SUMAtriptan (IMITREX) 100 MG tablet Take 1 tablet (100 mg total) by mouth every 2 (two) hours as needed for migraine. May repeat in 2 hours if headache persists or recurs. 10 tablet 11   No current facility-administered medications on file prior to visit.   She has No Known Allergies.  Review of Systems Pertinent items noted in HPI and remainder of comprehensive ROS otherwise negative.   Objective:   LMP 07/27/2021 BP 108/72 (BP Location: Left Arm, Patient Position: Sitting, Cuff Size: Normal)   Pulse 62   Ht 5\' 1"  (1.549 m)   Wt 113 lb 6.4 oz (51.4 kg)   LMP 07/27/2021   BMI 21.43 kg/m   General appearance: alert, cooperative, appears stated age, and no distress CVS exam: normal rate, regular rhythm, normal S1, S2, no murmurs, rubs, clicks or gallops. Lungs: Normal respiratory effort, chest expands symmetrically. Lungs are clear to auscultation, no crackles or wheezes. Abdomen: soft, non-tender; bowel sounds normal; no masses,  no organomegaly Pelvic: deferred Extremities: extremities normal, atraumatic, no cyanosis or edema Neurologic: Grossly normal   Assessment:   1. Encounter for surveillance of contraceptive pills      Plan:   Refill given on birth control. Continue annual surveillance.   Hildred Laser, MD [facility]

## 2021-08-21 ENCOUNTER — Encounter: Payer: BC Managed Care – PPO | Admitting: Obstetrics and Gynecology

## 2021-09-29 ENCOUNTER — Encounter: Payer: Self-pay | Admitting: Obstetrics and Gynecology

## 2021-11-14 ENCOUNTER — Telehealth: Payer: BC Managed Care – PPO | Admitting: Family

## 2021-11-14 DIAGNOSIS — H109 Unspecified conjunctivitis: Secondary | ICD-10-CM | POA: Diagnosis not present

## 2021-11-14 MED ORDER — POLYMYXIN B-TRIMETHOPRIM 10000-0.1 UNIT/ML-% OP SOLN: 1.0000 [drp] | Freq: Four times a day (QID) | OPHTHALMIC | 0 refills | Status: DC

## 2021-11-14 NOTE — Progress Notes (Signed)

## 2021-11-22 ENCOUNTER — Other Ambulatory Visit: Payer: Self-pay | Admitting: Family Medicine

## 2021-11-23 NOTE — Telephone Encounter (Signed)
CPE was on 06/25/21  Imitrex last filled on 11/05/20 #10 tabs with 11 refills  Zofran filled on 11/05/20 #15 tabs with 1 refill

## 2021-12-09 ENCOUNTER — Ambulatory Visit
Admission: RE | Admit: 2021-12-09 | Discharge: 2021-12-09 | Disposition: A | Discharge status: Home / Self Care | Payer: BC Managed Care – PPO | Source: Ambulatory Visit | Attending: Urgent Care | Admitting: Urgent Care

## 2021-12-09 ENCOUNTER — Other Ambulatory Visit: Payer: Self-pay

## 2021-12-09 VITALS — BP 148/72 | HR 77 | Temp 98.8°F | Resp 16

## 2021-12-09 DIAGNOSIS — J3489 Other specified disorders of nose and nasal sinuses: Secondary | ICD-10-CM | POA: Insufficient documentation

## 2021-12-09 DIAGNOSIS — J3089 Other allergic rhinitis: Secondary | ICD-10-CM | POA: Diagnosis not present

## 2021-12-09 DIAGNOSIS — J069 Acute upper respiratory infection, unspecified: Secondary | ICD-10-CM | POA: Diagnosis not present

## 2021-12-09 DIAGNOSIS — R070 Pain in throat: Secondary | ICD-10-CM | POA: Insufficient documentation

## 2021-12-09 DIAGNOSIS — R052 Subacute cough: Secondary | ICD-10-CM | POA: Insufficient documentation

## 2021-12-09 LAB — POCT RAPID STREP A (OFFICE): Rapid Strep A Screen: NEGATIVE

## 2021-12-09 MED ORDER — PREDNISONE 10 MG PO TABS: 30.0000 mg | ORAL_TABLET | Freq: Every day | ORAL | 0 refills | Status: DC

## 2021-12-09 NOTE — ED Provider Notes (Signed)
Allison Carney   MRN: [id number] DOB: 1985/11/21  Subjective:   Allison Carney is a 36 y.o. female presenting for 1 day history of throat pain, painful swallowing.  Patient has also had a runny and stuffy nose, bilateral ear fullness and pain since this past weekend.  She is also had a residual cough from the same illness.  States that she has chronic allergic rhinitis and takes a combo nasal spray, levocetirizine and Singulair daily.  No chest pain, shortness of breath or wheezing.  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  Patient did a COVID test at home and was negative.  She primarily wants to make sure she does not have strep throat.  No current facility-administered medications for this encounter.  Current Outpatient Medications:    Azelastine-Fluticasone 137-50 MCG/ACT SUSP, Place 1 spray into the nose in the morning and at bedtime., Disp: , Rfl:    levocetirizine (XYZAL) 5 MG tablet, Take 1 tablet by mouth daily as needed., Disp: , Rfl:    montelukast (SINGULAIR) 10 MG tablet, Take 1 tablet by mouth at bedtime., Disp: , Rfl:    Multiple Vitamins-Minerals (WOMENS MULTI PO), Take by mouth., Disp: , Rfl:    Norethindrone-Ethinyl Estradiol-Fe Biphas (LO LOESTRIN FE) 1 MG-10 MCG / 10 MCG tablet, Take 1 tablet by mouth daily., Disp: 84 tablet, Rfl: 3   ondansetron (ZOFRAN) 8 MG tablet, TAKE 1 TABLET (8 MG TOTAL) BY MOUTH EVERY 8 (EIGHT) HOURS AS NEEDED FOR NAUSEA OR VOMITING. WITH MIGRAINE, Disp: 15 tablet, Rfl: 3   SUMAtriptan (IMITREX) 100 MG tablet, TAKE 1 TABLET (100 MG TOTAL) BY MOUTH EVERY 2 (TWO) HOURS AS NEEDED FOR MIGRAINE. MAY REPEAT IN 2 HOURS IF HEADACHE PERSISTS OR RECURS., Disp: 10 tablet, Rfl: 11   trimethoprim-polymyxin b (POLYTRIM) ophthalmic solution, Place 1 drop into the left eye every 6 (six) hours., Disp: 10 mL, Rfl: 0   No Known Allergies  Past Medical History:  Diagnosis Date   Migraine    approx 1x/month   Wears contact  lenses      Past Surgical History:  Procedure Laterality Date   APPENDECTOMY     FACIAL LACERATION REPAIR Left 10/28/2017   Procedure: LEFT EAR LOBE LACERATION REPAIR;  Surgeon: Linus Salmons, MD;  Location: [facility];  Service: ENT;  Laterality: Left;  LOCAL    Family History  Problem Relation Age of Onset   Healthy Mother    Healthy Father    Leukemia Son        at 51 1/2 y old    Breast cancer Neg Hx    Ovarian cancer Neg Hx    Colon cancer Neg Hx     Social History   Tobacco Use   Smoking status: Never   Smokeless tobacco: Never  Vaping Use   Vaping Use: Never used  Substance Use Topics   Alcohol use: Not Currently   Drug use: No    ROS   Objective:   Vitals: BP (!) 148/72    Pulse 77    Temp 98.8 F (37.1 C) (Oral)    Resp 16    LMP  (LMP Unknown)    SpO2 98%   Physical Exam Constitutional:      General: She is not in acute distress.    Appearance: Normal appearance. She is well-developed and normal weight. She is not ill-appearing, toxic-appearing or diaphoretic.  HENT:     Head: Normocephalic and atraumatic.  Right Ear: Tympanic membrane, ear canal and external ear normal. No drainage or tenderness. No middle ear effusion. There is no impacted cerumen. Tympanic membrane is not erythematous.     Left Ear: Tympanic membrane, ear canal and external ear normal. No drainage or tenderness.  No middle ear effusion. There is no impacted cerumen. Tympanic membrane is not erythematous.     Nose: Congestion present. No rhinorrhea.     Mouth/Throat:     Mouth: Mucous membranes are moist. No oral lesions.     Pharynx: Posterior oropharyngeal erythema (with associated postnasal drainage overlying the pharynx) present. No pharyngeal swelling, oropharyngeal exudate or uvula swelling.     Tonsils: No tonsillar exudate or tonsillar abscesses. 0 on the right. 0 on the left.  Eyes:     General: No scleral icterus.       Right eye: No discharge.        Left  eye: No discharge.     Extraocular Movements: Extraocular movements intact.     Right eye: Normal extraocular motion.     Left eye: Normal extraocular motion.     Conjunctiva/sclera: Conjunctivae normal.  Cardiovascular:     Rate and Rhythm: Normal rate.     Heart sounds: No murmur heard.   No friction rub. No gallop.  Pulmonary:     Effort: Pulmonary effort is normal. No respiratory distress.     Breath sounds: No stridor. No wheezing, rhonchi or rales.  Chest:     Chest wall: No tenderness.  Musculoskeletal:     Cervical back: Normal range of motion and neck supple.  Lymphadenopathy:     Cervical: No cervical adenopathy.  Skin:    General: Skin is warm and dry.  Neurological:     General: No focal deficit present.     Mental Status: She is alert and oriented to person, place, and time.  Psychiatric:        Mood and Affect: Mood normal.        Behavior: Behavior normal.   Results for orders placed or performed during the hospital encounter of 12/09/21 (from the past 24 hour(s))  POCT rapid strep A     Status: None   Collection Time: 12/09/21  3:58 PM  Result Value Ref Range   Rapid Strep A Screen Negative Negative    Assessment and Plan :   PDMP not reviewed this encounter.  1. Viral URI   2. Throat pain   3. Stuffy and runny nose   4. Subacute cough   5. Allergic rhinitis due to other allergic trigger, unspecified seasonality     Strep culture pending, suspect viral uri worsened by her chronic allergic rhinitis. Offered an oral prednisone course in the context of her difficult and chronic allergic rhinitis. Use supportive care otherwise for a viral URI, acute viral syndrome. Patient declined COVID and flu test and I'm in agreement. Deferred imaging given clear cardiopulmonary exam, hemodynamically stable vital signs. Counseled patient on potential for adverse effects with medications prescribed/recommended today, ER and return-to-clinic precautions discussed, patient  verbalized understanding.    Wallis Bamberg, [place] 12/09/21 [phone]

## 2021-12-09 NOTE — ED Triage Notes (Signed)
Pt presents with ST since yesterday. ?

## 2021-12-12 LAB — CULTURE, GROUP A STREP (THRC)

## 2022-01-10 ENCOUNTER — Telehealth: Payer: BC Managed Care – PPO | Admitting: Nurse Practitioner

## 2022-01-10 DIAGNOSIS — J019 Acute sinusitis, unspecified: Secondary | ICD-10-CM

## 2022-01-10 DIAGNOSIS — B9689 Other specified bacterial agents as the cause of diseases classified elsewhere: Secondary | ICD-10-CM | POA: Diagnosis not present

## 2022-01-10 MED ORDER — AMOXICILLIN-POT CLAVULANATE 875-125 MG PO TABS: 1.0000 | ORAL_TABLET | Freq: Two times a day (BID) | ORAL | 0 refills | Status: AC

## 2022-01-10 NOTE — Progress Notes (Signed)
I have spent 5 minutes in review of e-visit questionnaire, review and updating patient chart, medical decision making and response to patient.  ° °Teala Daffron W Alyssha Housh, NP ° °  °

## 2022-01-10 NOTE — Progress Notes (Signed)

## 2022-01-31 ENCOUNTER — Ambulatory Visit
Admission: EM | Admit: 2022-01-31 | Discharge: 2022-01-31 | Disposition: A | Discharge status: Home / Self Care | Payer: BC Managed Care – PPO | Attending: Family Medicine | Admitting: Family Medicine

## 2022-01-31 ENCOUNTER — Encounter: Payer: Self-pay | Admitting: Emergency Medicine

## 2022-01-31 DIAGNOSIS — J020 Streptococcal pharyngitis: Secondary | ICD-10-CM | POA: Diagnosis not present

## 2022-01-31 LAB — POCT RAPID STREP A (OFFICE): Rapid Strep A Screen: POSITIVE — AB

## 2022-01-31 MED ORDER — AMOXICILLIN 875 MG PO TABS: 875.0000 mg | ORAL_TABLET | Freq: Two times a day (BID) | ORAL | 0 refills | Status: DC

## 2022-01-31 NOTE — ED Provider Notes (Signed)
?[facility] ? ? ? ?CSN: [id number] ?Arrival date & time: 01/31/22  0827 ? ? ?  ? ?History   ?Chief Complaint ?Chief Complaint  ?Patient presents with  ? Fever  ?  Sore throat, body aches - Entered by patient  ? Sore Throat  ? Generalized Body Aches  ? ? ?HPI ?Allison Carney is a 36 y.o. female.  ? ?HPI ?Patient presents for evaluation of sore throat and fever today. She has had body aches since today. 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 Denies any other symptoms. She has taken tylenol which has minimized throat pain.  ? ?Past Medical History:  ?Diagnosis Date  ? Migraine   ? approx 1x/month  ? Wears contact lenses   ? ? ?Patient Active Problem List  ? Diagnosis Date Noted  ? Acute foot pain, right 07/22/2021  ? Routine general medical examination at a health care facility 06/25/2021  ? Oligohydramnios 05/11/2019  ? Post-dates pregnancy 05/11/2019  ? Pyelectasis of fetus on prenatal ultrasound 01/31/2019  ? Migraine with aura 08/29/2018  ? Elevated LDL cholesterol level 08/12/2014  ? ? ?Past Surgical History:  ?Procedure Laterality Date  ? APPENDECTOMY    ? FACIAL LACERATION REPAIR Left 10/28/2017  ? Procedure: LEFT EAR LOBE LACERATION REPAIR;  Surgeon: Linus Salmons, MD;  Location: [facility];  Service: ENT;  Laterality: Left;  LOCAL  ? ? ?OB History   ? ? Gravida  ?1  ? Para  ?   ? Term  ?   ? Preterm  ?   ? AB  ?   ? Living  ?   ?  ? ? SAB  ?   ? IAB  ?   ? Ectopic  ?   ? Multiple  ?   ? Live Births  ?   ?   ?  ?  ? ? ? ?Home Medications   ? ?Prior to Admission medications   ?Medication Sig Start Date End Date Taking? Authorizing Provider  ?amoxicillin (AMOXIL) 875 MG tablet Take 1 tablet (875 mg total) by mouth 2 (two) times daily. 01/31/22  Yes Bing Neighbors, FNP  ?Azelastine-Fluticasone 137-50 MCG/ACT SUSP Place 1 spray into the nose in the morning and at bedtime. 10/09/20   [provider]  ?levocetirizine (XYZAL) 5 MG tablet Take 1 tablet by mouth daily  as needed. 10/09/20   [provider]  ?montelukast (SINGULAIR) 10 MG tablet Take 1 tablet by mouth at bedtime. 10/09/20   [provider]  ?Multiple Vitamins-Minerals (WOMENS MULTI PO) Take by mouth.    [provider]  ?Norethindrone-Ethinyl Estradiol-Fe Biphas (LO LOESTRIN FE) 1 MG-10 MCG / 10 MCG tablet Take 1 tablet by mouth daily. 08/20/21   Hildred Laser, MD  ?ondansetron (ZOFRAN) 8 MG tablet TAKE 1 TABLET (8 MG TOTAL) BY MOUTH EVERY 8 (EIGHT) HOURS AS NEEDED FOR NAUSEA OR VOMITING. WITH MIGRAINE 11/23/21   Tower, Audrie Gallus, MD  ?predniSONE (DELTASONE) 10 MG tablet Take 3 tablets (30 mg total) by mouth daily with breakfast. 12/09/21   Wallis Bamberg, PA-C  ?SUMAtriptan (IMITREX) 100 MG tablet TAKE 1 TABLET (100 MG TOTAL) BY MOUTH EVERY 2 (TWO) HOURS AS NEEDED FOR MIGRAINE. MAY REPEAT IN 2 HOURS IF HEADACHE PERSISTS OR RECURS. 11/23/21   Tower, Audrie Gallus, MD  ?trimethoprim-polymyxin b (POLYTRIM) ophthalmic solution Place 1 drop into the left eye every 6 (six) hours. 11/14/21   Junie Spencer, FNP  ? ? ?  Family History ?Family History  ?Problem Relation Age of Onset  ? Healthy Mother   ? Healthy Father   ? Leukemia Son   ?     at 87 1/2 y old   ? Breast cancer Neg Hx   ? Ovarian cancer Neg Hx   ? Colon cancer Neg Hx   ? ? ?Social History ?Social History  ? ?Tobacco Use  ? Smoking status: Never  ? Smokeless tobacco: Never  ?Vaping Use  ? Vaping Use: Never used  ?Substance Use Topics  ? Alcohol use: Not Currently  ? Drug use: No  ? ? ? ?Allergies   ?Patient has no known allergies. ? ? ?Review of Systems ?Review of Systems ?Pertinent negatives listed in HPI  ?Physical Exam ?Triage Vital Signs ?ED Triage Vitals  ?Enc Vitals Group  ?   BP 01/31/22 0835 112/74  ?   Pulse Rate 01/31/22 0835 (!) 114  ?   Resp 01/31/22 0835 18  ?   Temp 01/31/22 0835 100.1 ?F (37.8 ?C)  ?   Temp Source 01/31/22 0835 Oral  ?   SpO2 --   ?   Weight --   ?   Height --   ?   Head Circumference --   ?   Peak Flow --   ?   Pain  Score 01/31/22 0834 0  ?   Pain Loc --   ?   Pain Edu? --   ?   Excl. in GC? --   ? ?No data found. ? ?Updated Vital Signs ?BP 112/74 (BP Location: Left Arm)   Pulse (!) 114   Temp 100.1 ?F (37.8 ?C) (Oral)   Resp 18   LMP  (LMP Unknown)  ? ?Visual Acuity ?Right Eye Distance:   ?Left Eye Distance:   ?Bilateral Distance:   ? ?Right Eye Near:   ?Left Eye Near:    ?Bilateral Near:    ? ?Physical Exam ?Constitutional:   ?   Appearance: She is well-developed. She is ill-appearing.  ?HENT:  ?   Head: Normocephalic and atraumatic.  ?   Mouth/Throat:  ?   Pharynx: Oropharyngeal exudate, posterior oropharyngeal erythema and uvula swelling present.  ?Cardiovascular:  ?   Rate and Rhythm: Normal rate and regular rhythm.  ?Skin: ?   General: Skin is warm and dry.  ?   Capillary Refill: Capillary refill takes less than 2 seconds.  ?Neurological:  ?   General: No focal deficit present.  ?   Mental Status: She is alert.  ?Psychiatric:     ?   Mood and Affect: Mood normal.  ? ? ? ?UC Treatments / Results  ?Labs ?(all labs ordered are listed, but only abnormal results are displayed) ?Labs Reviewed  ?POCT RAPID STREP A (OFFICE) - Abnormal; Notable for the following components:  ?    Result Value  ? Rapid Strep A Screen Positive (*)   ? All other components within normal limits  ? ? ?EKG ? ? ?Radiology ?No results found. ? ?Procedures ?Procedures (including critical care time) ? ?Medications Ordered in UC ?Medications - No data to display ? ?Initial Impression / Assessment and Plan / UC Course  ?I have reviewed the triage vital signs and the nursing notes. ? ?Pertinent labs & imaging results that were available during my care of the patient were reviewed by me and considered in my medical decision making (see chart for details). ? ?  ?Strep Infection, confirmed by rapid strep ?Treatment with  Amoxicillin BID x 10 days ?Continue Tylenol and Ibuprofen. ?Hydrate well with water. ?Work note provided. ?RTC as needed. ?Final Clinical  Impressions(s) / UC Diagnoses  ? ?Final diagnoses:  ?Streptococcal sore throat  ? ? ? ?Discharge Instructions   ? ?  ?Hydrate well with fluids. ?Complete entire round of antibiotics. ?Ibuprofen as needed for fever. ? ? ?ED Prescriptions   ? ? Medication Sig Dispense Auth. Provider  ? amoxicillin (AMOXIL) 875 MG tablet Take 1 tablet (875 mg total) by mouth 2 (two) times daily. 20 tablet Bing NeighborsHarris, Florabel Faulks S, FNP  ? ?  ? ?PDMP not reviewed this encounter. ?  ?Bing NeighborsHarris, Feleica Fulmore S, FNP ?01/31/22 1038 ? ?

## 2022-01-31 NOTE — ED Triage Notes (Signed)
Pt presents with fever, ST and bodyaches since this morning.  ?

## 2022-01-31 NOTE — Discharge Instructions (Addendum)
Hydrate well with fluids. ?Complete entire round of antibiotics. ?Ibuprofen as needed for fever. ?

## 2022-02-25 ENCOUNTER — Ambulatory Visit
Admission: RE | Admit: 2022-02-25 | Discharge: 2022-02-25 | Disposition: A | Discharge status: Home / Self Care | Payer: BC Managed Care – PPO | Source: Ambulatory Visit | Attending: Emergency Medicine | Admitting: Emergency Medicine

## 2022-02-25 VITALS — BP 122/78 | HR 81 | Temp 97.9°F | Resp 18

## 2022-02-25 DIAGNOSIS — J020 Streptococcal pharyngitis: Secondary | ICD-10-CM | POA: Diagnosis not present

## 2022-02-25 LAB — POCT RAPID STREP A (OFFICE): Rapid Strep A Screen: POSITIVE — AB

## 2022-02-25 MED ORDER — AMOXICILLIN-POT CLAVULANATE 875-125 MG PO TABS: 1.0000 | ORAL_TABLET | Freq: Two times a day (BID) | ORAL | 0 refills | Status: AC

## 2022-02-25 NOTE — Discharge Instructions (Addendum)
Take the Augmentin as directed for strep throat.  Take Tylenol or ibuprofen as needed for fever or discomfort.  Schedule a follow-up appointment with your primary care provider. ?

## 2022-02-25 NOTE — ED Provider Notes (Signed)
?[facility] ? ? ? ?CSN: [id number] ?Arrival date & time: 02/25/22  1911 ? ? ?  ? ?History   ?Chief Complaint ?Chief Complaint  ?Patient presents with  ? Sore Throat  ?  Throat is on fire and ears hurt. - Entered by patient  ? Otalgia  ? ? ?HPI ?Allison Carney is a 36 y.o. female.  Patient presents with sore throat and ear pain x2 days.  Treatment at home with ibuprofen and DayQuil.  No fever, rash, cough, shortness of breath, vomiting, diarrhea, or other symptoms.  Patient was seen at this urgent care on 01/31/2022; diagnosed with strep throat; treated with amoxicillin. ? ?The history is provided by the patient and medical records.  ? ?Past Medical History:  ?Diagnosis Date  ? Migraine   ? approx 1x/month  ? Wears contact lenses   ? ? ?Patient Active Problem List  ? Diagnosis Date Noted  ? Acute foot pain, right 07/22/2021  ? Routine general medical examination at a health care facility 06/25/2021  ? Oligohydramnios 05/11/2019  ? Post-dates pregnancy 05/11/2019  ? Pyelectasis of fetus on prenatal ultrasound 01/31/2019  ? Migraine with aura 08/29/2018  ? Elevated LDL cholesterol level 08/12/2014  ? ? ?Past Surgical History:  ?Procedure Laterality Date  ? APPENDECTOMY    ? FACIAL LACERATION REPAIR Left 10/28/2017  ? Procedure: LEFT EAR LOBE LACERATION REPAIR;  Surgeon: Linus Salmons, MD;  Location: [facility];  Service: ENT;  Laterality: Left;  LOCAL  ? ? ?OB History   ? ? Gravida  ?1  ? Para  ?   ? Term  ?   ? Preterm  ?   ? AB  ?   ? Living  ?   ?  ? ? SAB  ?   ? IAB  ?   ? Ectopic  ?   ? Multiple  ?   ? Live Births  ?   ?   ?  ?  ? ? ? ?Home Medications   ? ?Prior to Admission medications   ?Medication Sig Start Date End Date Taking? Authorizing Provider  ?amoxicillin-clavulanate (AUGMENTIN) 875-125 MG tablet Take 1 tablet by mouth every 12 (twelve) hours for 10 days. 02/25/22 03/07/22 Yes Mickie Bail, NP  ?Azelastine-Fluticasone 137-50 MCG/ACT SUSP Place 1 spray into the nose in the morning and  at bedtime. 10/09/20   [provider]  ?levocetirizine (XYZAL) 5 MG tablet Take 1 tablet by mouth daily as needed. 10/09/20   [provider]  ?montelukast (SINGULAIR) 10 MG tablet Take 1 tablet by mouth at bedtime. 10/09/20   [provider]  ?Multiple Vitamins-Minerals (WOMENS MULTI PO) Take by mouth.    [provider]  ?Norethindrone-Ethinyl Estradiol-Fe Biphas (LO LOESTRIN FE) 1 MG-10 MCG / 10 MCG tablet Take 1 tablet by mouth daily. 08/20/21   Hildred Laser, MD  ?ondansetron (ZOFRAN) 8 MG tablet TAKE 1 TABLET (8 MG TOTAL) BY MOUTH EVERY 8 (EIGHT) HOURS AS NEEDED FOR NAUSEA OR VOMITING. WITH MIGRAINE 11/23/21   Tower, Audrie Gallus, MD  ?predniSONE (DELTASONE) 10 MG tablet Take 3 tablets (30 mg total) by mouth daily with breakfast. 12/09/21   Wallis Bamberg, PA-C  ?SUMAtriptan (IMITREX) 100 MG tablet TAKE 1 TABLET (100 MG TOTAL) BY MOUTH EVERY 2 (TWO) HOURS AS NEEDED FOR MIGRAINE. MAY REPEAT IN 2 HOURS IF HEADACHE PERSISTS OR RECURS. 11/23/21   Tower, Audrie Gallus, MD  ?trimethoprim-polymyxin b (POLYTRIM) ophthalmic solution Place 1 drop into the left eye  every 6 (six) hours. 11/14/21   Junie Spencer, FNP  ? ? ?Family History ?Family History  ?Problem Relation Age of Onset  ? Healthy Mother   ? Healthy Father   ? Leukemia Son   ?     at 57 1/2 y old   ? Breast cancer Neg Hx   ? Ovarian cancer Neg Hx   ? Colon cancer Neg Hx   ? ? ?Social History ?Social History  ? ?Tobacco Use  ? Smoking status: Never  ? Smokeless tobacco: Never  ?Vaping Use  ? Vaping Use: Never used  ?Substance Use Topics  ? Alcohol use: Not Currently  ? Drug use: No  ? ? ? ?Allergies   ?Patient has no known allergies. ? ? ?Review of Systems ?Review of Systems  ?Constitutional:  Negative for chills and fever.  ?HENT:  Positive for ear pain and sore throat.   ?Respiratory:  Negative for cough and shortness of breath.   ?Gastrointestinal:  Negative for diarrhea and vomiting.  ?Skin:  Negative for color change and rash.  ?All  other systems reviewed and are negative. ? ? ?Physical Exam ?Triage Vital Signs ?ED Triage Vitals  ?Enc Vitals Group  ?   BP   ?   Pulse   ?   Resp   ?   Temp   ?   Temp src   ?   SpO2   ?   Weight   ?   Height   ?   Head Circumference   ?   Peak Flow   ?   Pain Score   ?   Pain Loc   ?   Pain Edu?   ?   Excl. in GC?   ? ?No data found. ? ?Updated Vital Signs ?BP 122/78   Pulse 81   Temp 97.9 ?F (36.6 ?C)   Resp 18   LMP 02/06/2022   SpO2 99%  ? ?Visual Acuity ?Right Eye Distance:   ?Left Eye Distance:   ?Bilateral Distance:   ? ?Right Eye Near:   ?Left Eye Near:    ?Bilateral Near:    ? ?Physical Exam ?Vitals and nursing note reviewed.  ?Constitutional:   ?   General: She is not in acute distress. ?   Appearance: Normal appearance. She is well-developed. She is not ill-appearing.  ?HENT:  ?   Right Ear: Tympanic membrane normal.  ?   Left Ear: Tympanic membrane normal.  ?   Nose: Nose normal.  ?   Mouth/Throat:  ?   Mouth: Mucous membranes are moist.  ?   Pharynx: Posterior oropharyngeal erythema present.  ?Cardiovascular:  ?   Rate and Rhythm: Normal rate and regular rhythm.  ?   Heart sounds: Normal heart sounds.  ?Pulmonary:  ?   Effort: Pulmonary effort is normal. No respiratory distress.  ?   Breath sounds: Normal breath sounds.  ?Musculoskeletal:  ?   Cervical back: Neck supple.  ?Skin: ?   General: Skin is warm and dry.  ?Neurological:  ?   Mental Status: She is alert.  ?Psychiatric:     ?   Mood and Affect: Mood normal.     ?   Behavior: Behavior normal.  ? ? ? ?UC Treatments / Results  ?Labs ?(all labs ordered are listed, but only abnormal results are displayed) ?Labs Reviewed  ?POCT RAPID STREP A (OFFICE) - Abnormal; Notable for the following components:  ?    Result Value  ?  Rapid Strep A Screen Positive (*)   ? All other components within normal limits  ? ? ?EKG ? ? ?Radiology ?No results found. ? ?Procedures ?Procedures (including critical care time) ? ?Medications Ordered in UC ?Medications - No  data to display ? ?Initial Impression / Assessment and Plan / UC Course  ?I have reviewed the triage vital signs and the nursing notes. ? ?Pertinent labs & imaging results that were available during my care of the patient were reviewed by me and considered in my medical decision making (see chart for details). ? ?Strep pharyngitis.  Rapid strep positive.  Patient was diagnosed with strep throat on 01/31/2022 and treated with amoxicillin.  She is strep positive again today.  Treating with Augmentin.  Tylenol or ibuprofen as needed.  Instructed her to schedule a follow-up appointment with her PCP for reevaluation in 7 to 10 days.  Education provided on strep throat.  She agrees to plan of care. ? ? ?Final Clinical Impressions(s) / UC Diagnoses  ? ?Final diagnoses:  ?Strep pharyngitis  ? ? ? ?Discharge Instructions   ? ?  ?Take the Augmentin as directed for strep throat.  Take Tylenol or ibuprofen as needed for fever or discomfort.  Schedule a follow-up appointment with your primary care provider. ? ? ? ? ?ED Prescriptions   ? ? Medication Sig Dispense Auth. Provider  ? amoxicillin-clavulanate (AUGMENTIN) 875-125 MG tablet Take 1 tablet by mouth every 12 (twelve) hours for 10 days. 20 tablet Mickie Bailate, Maridee Slape H, NP  ? ?  ? ?PDMP not reviewed this encounter. ?  ?Mickie Bailate, Orine Goga H, NP ?02/25/22 1953 ? ?

## 2022-02-25 NOTE — ED Triage Notes (Addendum)
Patient presents to Urgent Care with complaints of sore throat and bilateral ear pain x 2 days. Treating symptoms with dayquil and ibuprofen.  ? ?Denies fever.  ?

## 2022-03-10 ENCOUNTER — Ambulatory Visit: Payer: BC Managed Care – PPO | Admitting: Family Medicine

## 2022-03-10 ENCOUNTER — Encounter: Payer: Self-pay | Admitting: Family Medicine

## 2022-03-10 ENCOUNTER — Ambulatory Visit (INDEPENDENT_AMBULATORY_CARE_PROVIDER_SITE_OTHER): Payer: BC Managed Care – PPO | Admitting: Family Medicine

## 2022-03-10 VITALS — BP 100/68 | HR 80 | Temp 98.7°F | Ht 61.0 in | Wt 112.1 lb

## 2022-03-10 DIAGNOSIS — J020 Streptococcal pharyngitis: Secondary | ICD-10-CM | POA: Diagnosis not present

## 2022-03-10 DIAGNOSIS — J029 Acute pharyngitis, unspecified: Secondary | ICD-10-CM | POA: Diagnosis not present

## 2022-03-10 LAB — POCT RAPID STREP A (OFFICE): Rapid Strep A Screen: POSITIVE — AB

## 2022-03-10 MED ORDER — CEFDINIR 300 MG PO CAPS: 600.0000 mg | ORAL_CAPSULE | Freq: Every day | ORAL | 0 refills | Status: DC

## 2022-03-10 NOTE — Progress Notes (Unsigned)
Allison Carney T. Allison Brines, MD, CAQ Sports Medicine [facility] [address] [place], [address]  Phone: [phone]  FAX: [phone]  [name] - 36 y.o. female  MRN [id number]  Date of Birth: 16-Sep-1986  Date: 03/10/2022  PCP: Judy Pimple, MD  Referral: Judy Pimple, MD  Chief Complaint  Patient presents with   Sore Throat    exposed   Subjective:   Allison Carney is a 36 y.o. very pleasant female patient with Body mass index is 21.19 kg/m. who presents with the following:  Patient has confirmed strep today with his positive strep test.  She is actually had strep throat twice confirmed over the last month or so.  She also has had strep throat in her son who is in daycare twice.  She also works as a Runner, broadcasting/film/video.  In both cases there is a strep outbreak.  She teaches fourth grade.  She is otherwise a perfectly healthy young lady with no significant long-term medical problems.   She was previously treated with amoxicillin, and then she was treated with Augmentin.  Review of Systems is noted in the HPI, as appropriate  Objective:   BP 100/68 (BP Location: Left Arm, Patient Position: Sitting)   Pulse 80   Temp 98.7 F (37.1 C) (Oral)   Ht 5\' 1"  (1.549 m)   Wt 112 lb 2 oz (50.9 kg)   LMP 02/03/2022   SpO2 98%   BMI 21.19 kg/m    Gen: WDWN, NAD. Globally Non-toxic HEENT: Normocephalic and atraumatic. Throat: No exudate R TM clear, L TM - good landmarks, No fluid present. rhinnorhea. No frontal or maxillary sinus T. MMM NECK: Anterior cervical  LAD is present - TTP CV: RRR, No M/G/R, cap refill <2 sec PULM: Breathing comfortably in no respiratory distress. no wheezing, crackles, rhonchi   Laboratory and Imaging Data: Results for orders placed or performed in visit on 03/10/22  POCT rapid strep A  Result Value Ref Range   Rapid Strep A Screen Positive (A) Negative     Assessment and Plan:     ICD-10-CM   1. Strep  throat  J02.0     2. Sore throat  J02.9 POCT rapid strep A     Confirmed strep.  Multiple exposures to strep throat.  She also has had recently, also cannot exclude resistance.  We will treat with cephalosporin.  Medication Management during today's office visit: Meds ordered this encounter  Medications   cefdinir (OMNICEF) 300 MG capsule    Sig: Take 2 capsules (600 mg total) by mouth daily.    Dispense:  20 capsule    Refill:  0   There are no discontinued medications.  Orders placed today for conditions managed today: Orders Placed This Encounter  Procedures   POCT rapid strep A    Follow-up if needed: No follow-ups on file.  Dr[employer] One speech-to-text software was used for transcription in this dictation.  Possible transcriptional errors can occur using 03/12/22.   Signed,  Animal nutritionist. Dezerae Freiberger, MD   Outpatient Encounter Medications as of 03/10/2022  Medication Sig   Azelastine-Fluticasone 137-50 MCG/ACT SUSP Place 1 spray into the nose in the morning and at bedtime.   cefdinir (OMNICEF) 300 MG capsule Take 2 capsules (600 mg total) by mouth daily.   levocetirizine (XYZAL) 5 MG tablet Take 1 tablet by mouth daily as needed.   montelukast (SINGULAIR) 10 MG tablet Take 1 tablet  by mouth at bedtime.   Multiple Vitamins-Minerals (WOMENS MULTI PO) Take by mouth.   Norethindrone-Ethinyl Estradiol-Fe Biphas (LO LOESTRIN FE) 1 MG-10 MCG / 10 MCG tablet Take 1 tablet by mouth daily.   ondansetron (ZOFRAN) 8 MG tablet TAKE 1 TABLET (8 MG TOTAL) BY MOUTH EVERY 8 (EIGHT) HOURS AS NEEDED FOR NAUSEA OR VOMITING. WITH MIGRAINE   SUMAtriptan (IMITREX) 100 MG tablet TAKE 1 TABLET (100 MG TOTAL) BY MOUTH EVERY 2 (TWO) HOURS AS NEEDED FOR MIGRAINE. MAY REPEAT IN 2 HOURS IF HEADACHE PERSISTS OR RECURS.   predniSONE (DELTASONE) 10 MG tablet Take 3 tablets (30 mg total) by mouth daily with breakfast. (Patient not taking: Reported on 03/10/2022)   trimethoprim-polymyxin b  (POLYTRIM) ophthalmic solution Place 1 drop into the left eye every 6 (six) hours. (Patient not taking: Reported on 03/10/2022)   No facility-administered encounter medications on file as of 03/10/2022.

## 2022-03-11 ENCOUNTER — Encounter: Payer: Self-pay | Admitting: Family Medicine

## 2022-04-07 ENCOUNTER — Ambulatory Visit: Payer: BC Managed Care – PPO | Admitting: Family Medicine

## 2022-04-07 ENCOUNTER — Encounter: Payer: Self-pay | Admitting: Family Medicine

## 2022-04-07 VITALS — BP 106/78 | HR 95 | Temp 98.2°F | Ht 61.0 in | Wt 118.0 lb

## 2022-04-07 DIAGNOSIS — J029 Acute pharyngitis, unspecified: Secondary | ICD-10-CM | POA: Diagnosis not present

## 2022-04-07 DIAGNOSIS — R21 Rash and other nonspecific skin eruption: Secondary | ICD-10-CM | POA: Insufficient documentation

## 2022-04-07 DIAGNOSIS — Z20818 Contact with and (suspected) exposure to other bacterial communicable diseases: Secondary | ICD-10-CM | POA: Insufficient documentation

## 2022-04-07 LAB — POCT RAPID STREP A (OFFICE): Rapid Strep A Screen: NEGATIVE

## 2022-04-07 NOTE — Assessment & Plan Note (Signed)
By the time she had her visit here the rash is pretty much resolved  Few tiny papules on lower abd, and R inner thigh  No erythema/scale/vesicles or excoriations   This may have been rxn to heat/sunscreen/product  Does not resemble scarlet fever or insect bites  inst to monitor  Watch for itching or worse rash and f/u if this occurs

## 2022-04-07 NOTE — Assessment & Plan Note (Signed)
Son recently had strep Pt feels a full feeling under ears /throat feels funny but not painful  Neg rapid strep test Will watch for ST/fever and alert if this occurs

## 2022-04-07 NOTE — Progress Notes (Signed)
Subjective:    Patient ID: Allison Carney, female    DOB: [birth year], 36 y.o.   MRN: [id number]  HPI Pt presents with c/o skin problem/rash Also exp to strep   Wt Readings from Last 3 Encounters:  04/07/22 118 lb (53.5 kg)  03/10/22 112 lb 2 oz (50.9 kg)  08/20/21 113 lb 6.4 oz (51.4 kg)   22.30 kg/m  Recently at the beach  Wore sunscreen Then got some spots on her body (not itchy)  Did not get burned - was very careful   Inner thighs  Low abdomen   Not on arms or lower legs No face or scalp Not on palms or soles Not on chest or back    She put feet in ocean but not body Did not swim in a pool    No h/o topical allergies   Thinks her son had strep  Family has had 3 times since April  No sore throat or fever Feels ok  A little discomfort below her ears  Feels like her LN are swollen   Results for orders placed or performed in visit on 04/07/22  Rapid Strep A  Result Value Ref Range   Rapid Strep A Screen Negative Negative   Patient Active Problem List   Diagnosis Date Noted   Rash and nonspecific skin eruption 04/07/2022   Exposure to strep throat 04/07/2022   Acute foot pain, right 07/22/2021   Routine general medical examination at a health care facility 06/25/2021   Oligohydramnios 05/11/2019   Post-dates pregnancy 05/11/2019   Pyelectasis of fetus on prenatal ultrasound 01/31/2019   Migraine with aura 08/29/2018   Elevated LDL cholesterol level 08/12/2014   Past Medical History:  Diagnosis Date   Migraine    approx 1x/month   Wears contact lenses    Past Surgical History:  Procedure Laterality Date   APPENDECTOMY     FACIAL LACERATION REPAIR Left 10/28/2017   Procedure: LEFT EAR LOBE LACERATION REPAIR;  Surgeon: Linus Salmons, MD;  Location: [facility];  Service: ENT;  Laterality: Left;  LOCAL   Social History   Tobacco Use   Smoking status: Never   Smokeless tobacco: Never  Vaping Use   Vaping Use: Never used  Substance  Use Topics   Alcohol use: Not Currently   Drug use: No   Family History  Problem Relation Age of Onset   Healthy Mother    Healthy Father    Leukemia Son        at 39 1/2 y old    Breast cancer Neg Hx    Ovarian cancer Neg Hx    Colon cancer Neg Hx    No Known Allergies Current Outpatient Medications on File Prior to Visit  Medication Sig Dispense Refill   Azelastine-Fluticasone 137-50 MCG/ACT SUSP Place 1 spray into the nose in the morning and at bedtime.     levocetirizine (XYZAL) 5 MG tablet Take 1 tablet by mouth daily as needed.     montelukast (SINGULAIR) 10 MG tablet Take 1 tablet by mouth at bedtime.     Multiple Vitamins-Minerals (WOMENS MULTI PO) Take by mouth.     Norethindrone-Ethinyl Estradiol-Fe Biphas (LO LOESTRIN FE) 1 MG-10 MCG / 10 MCG tablet Take 1 tablet by mouth daily. 84 tablet 3   SUMAtriptan (IMITREX) 100 MG tablet TAKE 1 TABLET (100 MG TOTAL) BY MOUTH EVERY 2 (TWO) HOURS AS NEEDED FOR MIGRAINE. MAY REPEAT IN 2 HOURS IF HEADACHE PERSISTS OR RECURS. 10  tablet 11   ondansetron (ZOFRAN) 8 MG tablet TAKE 1 TABLET (8 MG TOTAL) BY MOUTH EVERY 8 (EIGHT) HOURS AS NEEDED FOR NAUSEA OR VOMITING. WITH MIGRAINE (Patient not taking: Reported on 04/07/2022) 15 tablet 3   No current facility-administered medications on file prior to visit.      Review of Systems  Constitutional:  Negative for activity change, appetite change, fatigue, fever and unexpected weight change.  HENT:  Positive for postnasal drip. Negative for congestion, ear pain, rhinorrhea, sinus pressure and sore throat.        Lymph node area felt full/sensitive   Eyes:  Negative for pain, redness and visual disturbance.  Respiratory:  Negative for cough, shortness of breath and wheezing.   Cardiovascular:  Negative for chest pain and palpitations.  Gastrointestinal:  Negative for abdominal pain, blood in stool, constipation and diarrhea.  Endocrine: Negative for polydipsia and polyuria.  Genitourinary:   Negative for dysuria, frequency and urgency.  Musculoskeletal:  Negative for arthralgias, back pain and myalgias.  Skin:  Positive for rash. Negative for pallor.  Allergic/Immunologic: Negative for environmental allergies.  Neurological:  Negative for dizziness, syncope and headaches.  Hematological:  Negative for adenopathy. Does not bruise/bleed easily.  Psychiatric/Behavioral:  Negative for decreased concentration and dysphoric mood. The patient is not nervous/anxious.        Objective:   Physical Exam Constitutional:      General: She is not in acute distress.    Appearance: Normal appearance. She is normal weight. She is not ill-appearing or diaphoretic.  HENT:     Head: Normocephalic and atraumatic.     Right Ear: Tympanic membrane, ear canal and external ear normal.     Left Ear: Tympanic membrane, ear canal and external ear normal.     Nose: Nose normal.     Mouth/Throat:     Mouth: Mucous membranes are moist.     Pharynx: No oropharyngeal exudate or posterior oropharyngeal erythema.  Eyes:     General:        Right eye: No discharge.        Left eye: No discharge.     Conjunctiva/sclera: Conjunctivae normal.     Pupils: Pupils are equal, round, and reactive to light.  Cardiovascular:     Rate and Rhythm: Regular rhythm. Tachycardia present.     Heart sounds: Normal heart sounds. No murmur heard. Pulmonary:     Effort: Pulmonary effort is normal. No respiratory distress.     Breath sounds: Normal breath sounds. No wheezing.  Musculoskeletal:     Cervical back: Neck supple. No tenderness.  Lymphadenopathy:     Cervical: No cervical adenopathy.  Skin:    Coloration: Skin is not jaundiced or pale.     Findings: No erythema.     Comments: A few tiny pink papules on low abd and R inner thigh  No insect bites No scale or erythema or vesicles    Neurological:     Mental Status: She is alert.     Cranial Nerves: No cranial nerve deficit.  Psychiatric:        Mood and  Affect: Mood normal.           Assessment & Plan:   Problem List Items Addressed This Visit       Musculoskeletal and Integument   Rash and nonspecific skin eruption - Primary    By the time she had her visit here the rash is pretty much resolved  Few tiny papules on  lower abd, and R inner thigh  No erythema/scale/vesicles or excoriations   This may have been rxn to heat/sunscreen/product  Does not resemble scarlet fever or insect bites  inst to monitor  Watch for itching or worse rash and f/u if this occurs        Other   Exposure to strep throat    Son recently had strep Pt feels a full feeling under ears /throat feels funny but not painful  Neg rapid strep test Will watch for ST/fever and alert if this occurs      Other Visit Diagnoses     Sore throat       Relevant Orders   Rapid Strep A (Completed)

## 2022-04-07 NOTE — Patient Instructions (Addendum)
Your strep test is negative  If you develop sore throat or fever - let us know   If rash does not continue to improve or if it worsens or changes- let us know   If you use that sunscreen again and it happens- you may be allergic

## 2022-04-14 ENCOUNTER — Ambulatory Visit
Admission: RE | Admit: 2022-04-14 | Discharge: 2022-04-14 | Disposition: A | Discharge status: Home / Self Care | Payer: BC Managed Care – PPO | Source: Ambulatory Visit | Attending: Family Medicine | Admitting: Family Medicine

## 2022-04-14 VITALS — BP 134/89 | HR 74 | Temp 98.9°F | Resp 18

## 2022-04-14 DIAGNOSIS — J029 Acute pharyngitis, unspecified: Secondary | ICD-10-CM | POA: Diagnosis not present

## 2022-04-14 LAB — POCT RAPID STREP A (OFFICE): Rapid Strep A Screen: NEGATIVE

## 2022-04-14 NOTE — Discharge Instructions (Addendum)
Throat culture is pending typically take up to 3 days to results. Continue allergy medications.  For  sore or scratchy throat, use a saltwater gargle-  to  teaspoon of salt dissolved in a 4-ounce to 8-ounce glass of warm water.  Gargle the solution for approximately 15-30 seconds and then spit.  It is important not to swallow the solution.  You can also use throat lozenges/cough drops and Chloraseptic spray to help with throat pain or discomfort.  Warm or cold liquids can also be helpful in relieving throat pain.

## 2022-04-14 NOTE — ED Provider Notes (Signed)
[facility]    CSN: [id number] Arrival date & time: 04/14/22  1636      History   Chief Complaint Chief Complaint  Patient presents with   Sore Throat    Entered by patient    HPI Allison Carney is a 36 y.o. female.   HPI Patient presents today with acute sore throat times today.  Patient has had a recurrent strep infection x1 month ago.  She reports being concerned as she has had progressive soreness of throat today.  She has no other URI or GI symptoms.  She is afebrile.  She was seen by her primary care provider a few days ago with sore throat had a rapid test which was negative however that sore throat resolved and she has developed soreness of throat today. Past Medical History:  Diagnosis Date   Migraine    approx 1x/month   Wears contact lenses     Patient Active Problem List   Diagnosis Date Noted   Rash and nonspecific skin eruption 04/07/2022   Exposure to strep throat 04/07/2022   Acute foot pain, right 07/22/2021   Routine general medical examination at a health care facility 06/25/2021   Oligohydramnios 05/11/2019   Post-dates pregnancy 05/11/2019   Pyelectasis of fetus on prenatal ultrasound 01/31/2019   Migraine with aura 08/29/2018   Elevated LDL cholesterol level 08/12/2014    Past Surgical History:  Procedure Laterality Date   APPENDECTOMY     FACIAL LACERATION REPAIR Left 10/28/2017   Procedure: LEFT EAR LOBE LACERATION REPAIR;  Surgeon: Linus Salmons, MD;  Location: [facility];  Service: ENT;  Laterality: Left;  LOCAL    OB History     Gravida  1   Para      Term      Preterm      AB      Living         SAB      IAB      Ectopic      Multiple      Live Births               Home Medications    Prior to Admission medications   Medication Sig Start Date End Date Taking? Authorizing Provider  Azelastine-Fluticasone 137-50 MCG/ACT SUSP Place 1 spray into the nose in the morning and at bedtime.  10/09/20   [provider]  levocetirizine (XYZAL) 5 MG tablet Take 1 tablet by mouth daily as needed. 10/09/20   [provider]  montelukast (SINGULAIR) 10 MG tablet Take 1 tablet by mouth at bedtime. 10/09/20   [provider]  Multiple Vitamins-Minerals (WOMENS MULTI PO) Take by mouth.    [provider]  Norethindrone-Ethinyl Estradiol-Fe Biphas (LO LOESTRIN FE) 1 MG-10 MCG / 10 MCG tablet Take 1 tablet by mouth daily. 08/20/21   Hildred Laser, MD  ondansetron (ZOFRAN) 8 MG tablet TAKE 1 TABLET (8 MG TOTAL) BY MOUTH EVERY 8 (EIGHT) HOURS AS NEEDED FOR NAUSEA OR VOMITING. WITH MIGRAINE Patient not taking: Reported on 04/07/2022 11/23/21   Tower, Audrie Gallus, MD  SUMAtriptan (IMITREX) 100 MG tablet TAKE 1 TABLET (100 MG TOTAL) BY MOUTH EVERY 2 (TWO) HOURS AS NEEDED FOR MIGRAINE. MAY REPEAT IN 2 HOURS IF HEADACHE PERSISTS OR RECURS. 11/23/21   Tower, Audrie Gallus, MD    Family History Family History  Problem Relation Age of Onset   Healthy Mother    Healthy Father    Leukemia Son  at 55 1/2 y old    Breast cancer Neg Hx    Ovarian cancer Neg Hx    Colon cancer Neg Hx     Social History Social History   Tobacco Use   Smoking status: Never   Smokeless tobacco: Never  Vaping Use   Vaping Use: Never used  Substance Use Topics   Alcohol use: Not Currently   Drug use: No     Allergies   Patient has no known allergies.   Review of Systems Review of Systems Pertinent negatives listed in HPI   Physical Exam Triage Vital Signs ED Triage Vitals [04/14/22 1658]  Enc Vitals Group     BP 134/89     Pulse Rate 74     Resp 18     Temp 98.9 F (37.2 C)     Temp Source Oral     SpO2 98 %     Weight      Height      Head Circumference      Peak Flow      Pain Score 4     Pain Loc      Pain Edu?      Excl. in GC?    No data found.  Updated Vital Signs BP 134/89 (BP Location: Left Arm)   Pulse 74   Temp 98.9 F (37.2 C) (Oral)   Resp 18    LMP 04/07/2022   SpO2 98%   Visual Acuity Right Eye Distance:   Left Eye Distance:   Bilateral Distance:    Right Eye Near:   Left Eye Near:    Bilateral Near:     Physical Exam  General Appearance:    Alert, cooperative, no distress  HENT:   Normocephalic, external ears normal, nares patent, oropharynx mild erythema w/o swelling, cervical adenopathy, neck normal range motion   Eyes:    PERRL, conjunctiva/corneas clear, EOM's intact       Lungs:     Clear to auscultation bilaterally, respirations unlabored  Heart:    Regular rate and rhythm  Neurologic:   Awake, alert, oriented x 3. No apparent focal neurological           defect.        UC Treatments / Results  Labs (all labs ordered are listed, but only abnormal results are displayed) Labs Reviewed  CULTURE, GROUP A STREP [facility])  POCT RAPID STREP A (OFFICE)    EKG   Radiology No results found.  Procedures Procedures (including critical care time)  Medications Ordered in UC Medications - No data to display  Initial Impression / Assessment and Plan / UC Course  I have reviewed the triage vital signs and the nursing notes.  Pertinent labs & imaging results that were available during my care of the patient were reviewed by me and considered in my medical decision making (see chart for details).    Rapid strep negative.  Will await throat culture before treating sore throat as the appearance is that of viral versus oropharyngeal irritation.  Encouraged to hydrate well with fluids.  Gargle with warm salt water.  Utilize over-the-counter Chloraseptic type sprays.  We will notify you if your throat culture is positive.  Follow-up with primary care provider or return if symptoms worsen or do not readily improve. Final Clinical Impressions(s) / UC Diagnoses   Final diagnoses:  Acute sore throat     Discharge Instructions      Throat culture is pending typically take  up to 3 days to results. Continue allergy  medications.  For  sore or scratchy throat, use a saltwater gargle-  to  teaspoon of salt dissolved in a 4-ounce to 8-ounce glass of warm water.  Gargle the solution for approximately 15-30 seconds and then spit.  It is important not to swallow the solution.  You can also use throat lozenges/cough drops and Chloraseptic spray to help with throat pain or discomfort.  Warm or cold liquids can also be helpful in relieving throat pain.      ED Prescriptions   None    PDMP not reviewed this encounter.   Bing Neighbors, [place] 04/16/22 [phone]

## 2022-04-14 NOTE — ED Triage Notes (Signed)
Pt presents with a ST since yesterday. Pt went to her PCP last week for a sorethroat and her strep was negative.

## 2022-04-17 LAB — CULTURE, GROUP A STREP (THRC)

## 2022-08-28 ENCOUNTER — Other Ambulatory Visit: Payer: Self-pay | Admitting: Obstetrics and Gynecology

## 2022-09-07 ENCOUNTER — Telehealth: Payer: Self-pay | Admitting: Family Medicine

## 2022-09-07 DIAGNOSIS — Z0000 Encounter for general adult medical examination without abnormal findings: Secondary | ICD-10-CM

## 2022-09-07 DIAGNOSIS — E7800 Pure hypercholesterolemia, unspecified: Secondary | ICD-10-CM

## 2022-09-07 NOTE — Telephone Encounter (Signed)
-----   Message from Ronalee Red, RT sent at 09/03/2022  8:47 AM EST ----- Regarding: Wed 11/22 lab Patient is scheduled for cpx, please order future labs.  Thanks, Jae Dire

## 2022-09-08 ENCOUNTER — Other Ambulatory Visit (INDEPENDENT_AMBULATORY_CARE_PROVIDER_SITE_OTHER): Payer: BC Managed Care – PPO

## 2022-09-08 DIAGNOSIS — E7800 Pure hypercholesterolemia, unspecified: Secondary | ICD-10-CM

## 2022-09-08 DIAGNOSIS — Z0000 Encounter for general adult medical examination without abnormal findings: Secondary | ICD-10-CM | POA: Diagnosis not present

## 2022-09-08 LAB — LIPID PANEL
Cholesterol: 194 mg/dL (ref 0–200)
HDL: 71.5 mg/dL (ref >39.00)
LDL Cholesterol: 110 mg/dL — ABNORMAL HIGH (ref 0–99)
NonHDL: 122.03
Total CHOL/HDL Ratio: 3
Triglycerides: 59 mg/dL (ref 0.0–149.0)
VLDL: 11.8 mg/dL (ref 0.0–40.0)

## 2022-09-08 LAB — CBC WITH DIFFERENTIAL/PLATELET
Basophils Absolute: 0 10*3/uL (ref 0.0–0.1)
Basophils Relative: 0.5 % (ref 0.0–3.0)
Eosinophils Absolute: 0 10*3/uL (ref 0.0–0.7)
Eosinophils Relative: 1 % (ref 0.0–5.0)
HCT: 44.7 % (ref 36.0–46.0)
Hemoglobin: 15 g/dL (ref 12.0–15.0)
Lymphocytes Relative: 27.1 % (ref 12.0–46.0)
Lymphs Abs: 1.1 10*3/uL (ref 0.7–4.0)
MCHC: 33.5 g/dL (ref 30.0–36.0)
MCV: 86.6 fl (ref 78.0–100.0)
Monocytes Absolute: 0.3 10*3/uL (ref 0.1–1.0)
Monocytes Relative: 6.2 % (ref 3.0–12.0)
Neutro Abs: 2.7 10*3/uL (ref 1.4–7.7)
Neutrophils Relative %: 65.2 % (ref 43.0–77.0)
Platelets: 219 10*3/uL (ref 150.0–400.0)
RBC: 5.16 Mil/uL — ABNORMAL HIGH (ref 3.87–5.11)
RDW: 12.5 % (ref 11.5–15.5)
WBC: 4.2 10*3/uL (ref 4.0–10.5)

## 2022-09-08 LAB — COMPREHENSIVE METABOLIC PANEL
ALT: 8 U/L (ref 0–35)
AST: 20 U/L (ref 0–37)
Albumin: 4.8 g/dL (ref 3.5–5.2)
Alkaline Phosphatase: 32 U/L — ABNORMAL LOW (ref 39–117)
BUN: 13 mg/dL (ref 6–23)
CO2: 31 mEq/L (ref 19–32)
Calcium: 9.4 mg/dL (ref 8.4–10.5)
Chloride: 101 mEq/L (ref 96–112)
Creatinine, Ser: 0.61 mg/dL (ref 0.40–1.20)
GFR: 115.9 mL/min (ref >60.00)
Glucose, Bld: 83 mg/dL (ref 70–99)
Potassium: 4.1 mEq/L (ref 3.5–5.1)
Sodium: 137 mEq/L (ref 135–145)
Total Bilirubin: 0.5 mg/dL (ref 0.2–1.2)
Total Protein: 7.2 g/dL (ref 6.0–8.3)

## 2022-09-08 LAB — TSH: TSH: 1.06 u[IU]/mL (ref 0.35–5.50)

## 2022-09-14 ENCOUNTER — Encounter: Payer: Self-pay | Admitting: Family Medicine

## 2022-09-14 ENCOUNTER — Ambulatory Visit (INDEPENDENT_AMBULATORY_CARE_PROVIDER_SITE_OTHER): Payer: BC Managed Care – PPO | Admitting: Family Medicine

## 2022-09-14 VITALS — BP 104/66 | HR 73 | Temp 97.9°F | Ht 61.0 in | Wt 110.4 lb

## 2022-09-14 DIAGNOSIS — J069 Acute upper respiratory infection, unspecified: Secondary | ICD-10-CM | POA: Insufficient documentation

## 2022-09-14 DIAGNOSIS — Z0000 Encounter for general adult medical examination without abnormal findings: Secondary | ICD-10-CM

## 2022-09-14 DIAGNOSIS — Z806 Family history of leukemia: Secondary | ICD-10-CM

## 2022-09-14 DIAGNOSIS — E7800 Pure hypercholesterolemia, unspecified: Secondary | ICD-10-CM | POA: Diagnosis not present

## 2022-09-14 DIAGNOSIS — G43109 Migraine with aura, not intractable, without status migrainosus: Secondary | ICD-10-CM | POA: Diagnosis not present

## 2022-09-14 NOTE — Assessment & Plan Note (Addendum)
Reviewed health habits including diet and exercise and skin cancer prevention Reviewed appropriate screening tests for age  Also reviewed health mt list, fam hx and immunization status , as well as social and family history   See HPI Labs reviwed  Enc self breast exams  Pap utd 2020 with no h/o hpv and no new partners Using condoms for contraception (OC caused migraine) may consider pregnancy in the future    Offered gyn ref if she wants to disc iud or for pregnancy in the future  Flu shot up to date as well as covid booster  Cholesterol is improved/ commended good health habits  Has counseling for mental health in setting of stress (son with leukemia)

## 2022-09-14 NOTE — Assessment & Plan Note (Signed)
Improved today with better diet and exercise  Disc goals for lipids and reasons to control them Rev last labs with pt Rev low sat fat diet in detail  LDL is down to 110  Enc her to keep up the good work

## 2022-09-14 NOTE — Patient Instructions (Signed)
Continue using condoms for contraception  You may be a candidate for IUD later  If you need a referral to new gyn practice in the future let us know  Keep exercising  Keep fluids up  Keep up the great diet  Cholesterol is looking better also   Labs look good

## 2022-09-14 NOTE — Progress Notes (Signed)
Subjective:    Patient ID: Allison Carney, female    DOB: 02-07-1986, 36 y.o.   MRN: [id number]  HPI Here for health maintenance exam and to review chronic medical problems    Wt Readings from Last 3 Encounters:  09/14/22 110 lb 6 oz (50.1 kg)  04/07/22 118 lb (53.5 kg)  03/10/22 112 lb 2 oz (50.9 kg)   20.86 kg/m  Doing ok overall  Has a head cold - getting over it now (since Sunday)  She has not done a covid test so far - will do one at home  Cough  Some nasal congestion /ear congestion  No fever  Has been exercising without problems  No sore throat   Eats enough  Eats all day  She is good about regular meals and snacks  Runs 3 times per week Weights the other days   Otc- some mucinex   She had strep throat multiple times last year- finally got rid of it in February    Her son relapsed with cancer  She is being very careful  He is on immuno therapy  Will transpldow  Exercises a lot  Works when she can  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   Immunization History  Administered Date(s) Administered   Influenza, High Dose Seasonal PF 12/14/2016, 10/09/2020   Influenza,inj,Quad PF,6+ Mos 08/14/2015, 08/04/2017, 07/19/2018, 07/17/2019, 07/23/2020, 06/25/2021   Moderna Sars-Covid-2 Vaccination 12/15/2019, 01/12/2020, 10/09/2020   Tdap 03/29/2014, 03/01/2019   Health Maintenance Due  Topic Date Due   Hepatitis C Screening  Never done   COVID-19 Vaccine (4 - 2023-24 season) 06/18/2022    Pap 10/2018  No symptoms   Menses Contraception -she stopped it / was considering getting pregnant (then her son relapsed with leukemia)  No migraine since stopping  Using condoms  No new parters No h/o hpv or abn paps   No fam h/o cancer   Self breast exam: no lumps   Had flu shot 06/2022  Had covid booster   BP Readings from Last 3 Encounters:  09/14/22 104/66  04/14/22 134/89  04/07/22 106/78   Pulse Readings from Last 3 Encounters:  09/14/22 73   04/14/22 74  04/07/22 95    Migraines Topamax   H/o allergies Xyzal Asteline-fluticasone spray Singulair    Hyperlipidemia  Lab Results  Component Value Date   CHOL 194 09/08/2022   CHOL 203 (H) 06/25/2021   CHOL 212 (H) 08/16/2016   Lab Results  Component Value Date   HDL 71.50 09/08/2022   HDL 64.60 06/25/2021   HDL 69.10 08/16/2016   Lab Results  Component Value Date   LDLCALC 110 (H) 09/08/2022   LDLCALC 121 (H) 06/25/2021   LDLCALC 124 (H) 08/16/2016   Lab Results  Component Value Date   TRIG 59.0 09/08/2022   TRIG 85.0 06/25/2021   TRIG 94.0 08/16/2016   Lab Results  Component Value Date   CHOLHDL 3 09/08/2022   CHOLHDL 3 06/25/2021   CHOLHDL 3 08/16/2016   Lab Results  Component Value Date   LDLDIRECT 154.1 04/16/2013   This is improved HDL is up  LDL is down   Healthy eater  Avoids red meat and fried foods   Mother has mildly high cholesterol     Other labs  Results for orders placed or performed in visit on 09/08/22  Lipid panel  Result Value Ref Range   Cholesterol 194 0 - 200 mg/dL   Triglycerides 59.0 0.0 -  149.0 mg/dL   HDL 71.50 >39.00 mg/dL   VLDL 11.8 0.0 - 40.0 mg/dL   LDL Cholesterol 110 (H) 0 - 99 mg/dL   Total CHOL/HDL Ratio 3    NonHDL 122.03   CBC with Differential/Platelet  Result Value Ref Range   WBC 4.2 4.0 - 10.5 K/uL   RBC 5.16 (H) 3.87 - 5.11 Mil/uL   Hemoglobin 15.0 12.0 - 15.0 g/dL   HCT 44.7 36.0 - 46.0 %   MCV 86.6 78.0 - 100.0 fl   MCHC 33.5 30.0 - 36.0 g/dL   RDW 12.5 11.5 - 15.5 %   Platelets 219.0 150.0 - 400.0 K/uL   Neutrophils Relative % 65.2 43.0 - 77.0 %   Lymphocytes Relative 27.1 12.0 - 46.0 %   Monocytes Relative 6.2 3.0 - 12.0 %   Eosinophils Relative 1.0 0.0 - 5.0 %   Basophils Relative 0.5 0.0 - 3.0 %   Neutro Abs 2.7 1.4 - 7.7 K/uL   Lymphs Abs 1.1 0.7 - 4.0 K/uL   Monocytes Absolute 0.3 0.1 - 1.0 K/uL   Eosinophils Absolute 0.0 0.0 - 0.7 K/uL   Basophils Absolute 0.0 0.0 - 0.1  K/uL  Comprehensive metabolic panel  Result Value Ref Range   Sodium 137 135 - 145 mEq/L   Potassium 4.1 3.5 - 5.1 mEq/L   Chloride 101 96 - 112 mEq/L   CO2 31 19 - 32 mEq/L   Glucose, Bld 83 70 - 99 mg/dL   BUN 13 6 - 23 mg/dL   Creatinine, Ser 0.61 0.40 - 1.20 mg/dL   Total Bilirubin 0.5 0.2 - 1.2 mg/dL   Alkaline Phosphatase 32 (L) 39 - 117 U/L   AST 20 0 - 37 U/L   ALT 8 0 - 35 U/L   Total Protein 7.2 6.0 - 8.3 g/dL   Albumin 4.8 3.5 - 5.2 g/dL   GFR 115.90 >60.00 mL/min   Calcium 9.4 8.4 - 10.5 mg/dL  TSH  Result Value Ref Range   TSH 1.06 0.35 - 5.50 uIU/mL     Patient Active Problem List   Diagnosis Date Noted   Family history of leukemia 09/14/2022   Rash and nonspecific skin eruption 04/07/2022   Routine general medical examination at a health care facility 06/25/2021   Oligohydramnios 05/11/2019   Post-dates pregnancy 05/11/2019   Pyelectasis of fetus on prenatal ultrasound 01/31/2019   Migraine with aura 08/29/2018   Elevated LDL cholesterol level 08/12/2014   Past Medical History:  Diagnosis Date   Migraine    approx 1x/month   Wears contact lenses    Past Surgical History:  Procedure Laterality Date   APPENDECTOMY     FACIAL LACERATION REPAIR Left 10/28/2017   Procedure: LEFT EAR LOBE LACERATION REPAIR;  Surgeon: Beverly Gust, MD;  Location: [place];  Service: ENT;  Laterality: Left;  LOCAL   Social History   Tobacco Use   Smoking status: Never   Smokeless tobacco: Never  Vaping Use   Vaping Use: Never used  Substance Use Topics   Alcohol use: Not Currently   Drug use: No   Family History  Problem Relation Age of Onset   Healthy Mother    Healthy Father    Leukemia Son        at 66 1/2 y old    Breast cancer Neg Hx    Ovarian cancer Neg Hx    Colon cancer Neg Hx    No Known Allergies Current Outpatient Medications  on File Prior to Visit  Medication Sig Dispense Refill   Azelastine-Fluticasone 137-50 MCG/ACT SUSP Place 1  spray into the nose in the morning and at bedtime.     levocetirizine (XYZAL) 5 MG tablet Take 1 tablet by mouth daily as needed.     montelukast (SINGULAIR) 10 MG tablet Take 1 tablet by mouth at bedtime.     Multiple Vitamins-Minerals (WOMENS MULTI PO) Take by mouth.     ondansetron (ZOFRAN) 8 MG tablet TAKE 1 TABLET (8 MG TOTAL) BY MOUTH EVERY 8 (EIGHT) HOURS AS NEEDED FOR NAUSEA OR VOMITING. WITH MIGRAINE 15 tablet 3   SUMAtriptan (IMITREX) 100 MG tablet TAKE 1 TABLET (100 MG TOTAL) BY MOUTH EVERY 2 (TWO) HOURS AS NEEDED FOR MIGRAINE. MAY REPEAT IN 2 HOURS IF HEADACHE PERSISTS OR RECURS. 10 tablet 11   No current facility-administered medications on file prior to visit.     Review of Systems  Constitutional:  Negative for activity change, appetite change, fatigue, fever and unexpected weight change.  HENT:  Negative for congestion, ear pain, rhinorrhea, sinus pressure and sore throat.   Eyes:  Negative for pain, redness and visual disturbance.  Respiratory:  Negative for cough, shortness of breath and wheezing.   Cardiovascular:  Negative for chest pain and palpitations.  Gastrointestinal:  Negative for abdominal pain, blood in stool, constipation and diarrhea.  Endocrine: Negative for polydipsia and polyuria.  Genitourinary:  Negative for dysuria, frequency and urgency.  Musculoskeletal:  Negative for arthralgias, back pain and myalgias.  Skin:  Negative for pallor and rash.  Allergic/Immunologic: Negative for environmental allergies.  Neurological:  Negative for dizziness, syncope and headaches.       Headaches are better   Hematological:  Negative for adenopathy. Does not bruise/bleed easily.  Psychiatric/Behavioral:  Negative for decreased concentration and dysphoric mood. The patient is not nervous/anxious.        Stressors  Son with leukemia        Objective:   Physical Exam Constitutional:      General: She is not in acute distress.    Appearance: Normal appearance. She  is well-developed and normal weight. She is not ill-appearing or diaphoretic.  HENT:     Head: Normocephalic and atraumatic.     Right Ear: Tympanic membrane, ear canal and external ear normal.     Left Ear: Tympanic membrane, ear canal and external ear normal.     Nose: Nose normal. No congestion.     Comments: Boggy/congested nares     Mouth/Throat:     Mouth: Mucous membranes are moist.     Pharynx: Oropharynx is clear. No posterior oropharyngeal erythema.  Eyes:     General: No scleral icterus.    Extraocular Movements: Extraocular movements intact.     Conjunctiva/sclera: Conjunctivae normal.     Pupils: Pupils are equal, round, and reactive to light.  Neck:     Thyroid: No thyromegaly.     Vascular: No carotid bruit or JVD.  Cardiovascular:     Rate and Rhythm: Normal rate and regular rhythm.     Pulses: Normal pulses.     Heart sounds: Normal heart sounds.     No gallop.  Pulmonary:     Effort: Pulmonary effort is normal. No respiratory distress.     Breath sounds: Normal breath sounds. No wheezing.     Comments: Good air exch Chest:     Chest wall: No tenderness.  Abdominal:     General: Bowel sounds are normal.  There is no distension or abdominal bruit.     Palpations: Abdomen is soft. There is no mass.     Tenderness: There is no abdominal tenderness.     Hernia: No hernia is present.  Genitourinary:    Comments: Breast exam: No mass, nodules, thickening, tenderness, bulging, retraction, inflamation, nipple discharge or skin changes noted.  No axillary or clavicular LA.     Musculoskeletal:        General: No tenderness. Normal range of motion.     Cervical back: Normal range of motion and neck supple. No rigidity. No muscular tenderness.     Right lower leg: No edema.     Left lower leg: No edema.     Comments: No kyphosis   Lymphadenopathy:     Cervical: No cervical adenopathy.  Skin:    General: Skin is warm and dry.     Coloration: Skin is not pale.      Findings: No erythema or rash.     Comments: Some lengitines   Neurological:     Mental Status: She is alert. Mental status is at baseline.     Cranial Nerves: No cranial nerve deficit.     Motor: No abnormal muscle tone.     Coordination: Coordination normal.     Gait: Gait normal.     Deep Tendon Reflexes: Reflexes are normal and symmetric. Reflexes normal.  Psychiatric:        Mood and Affect: Mood normal.        Cognition and Memory: Cognition and memory normal.           Assessment & Plan:   Problem List Items Addressed This Visit       Cardiovascular and Mediastinum   Migraine with aura    Migraines are significantly improved off OC        Other   Elevated LDL cholesterol level    Improved today with better diet and exercise  Disc goals for lipids and reasons to control them Rev last labs with pt Rev low sat fat diet in detail  LDL is down to 110  Enc her to keep up the good work      Family history of leukemia    Son in toddler years  Relapsed at age 33       Routine general medical examination at a health care facility - Primary    Reviewed health habits including diet and exercise and skin cancer prevention Reviewed appropriate screening tests for age  Also reviewed health mt list, fam hx and immunization status , as well as social and family history   See HPI Labs reviwed  Enc self breast exams  Pap utd 2020 with no h/o hpv and no new partners Using condoms for contraception (OC caused migraine) may consider pregnancy in the future    Offered gyn ref if she wants to disc iud or for pregnancy in the future  Flu shot up to date as well as covid booster  Cholesterol is improved/ commended good health habits  Has counseling for mental health in setting of stress (son with leukemia)

## 2022-09-14 NOTE — Assessment & Plan Note (Signed)
Son in toddler years  Relapsed at age 35

## 2022-09-14 NOTE — Assessment & Plan Note (Signed)
Day 4-5  Mild and improving  Pt may consider covid test at home

## 2022-09-14 NOTE — Assessment & Plan Note (Signed)
Migraines are significantly improved off OC

## 2022-09-29 ENCOUNTER — Ambulatory Visit
Admission: EM | Admit: 2022-09-29 | Discharge: 2022-09-29 | Disposition: A | Discharge status: Home / Self Care | Payer: BC Managed Care – PPO | Attending: Urgent Care | Admitting: Urgent Care

## 2022-09-29 DIAGNOSIS — Z1152 Encounter for screening for COVID-19: Secondary | ICD-10-CM | POA: Diagnosis not present

## 2022-09-29 DIAGNOSIS — Z79899 Other long term (current) drug therapy: Secondary | ICD-10-CM | POA: Diagnosis not present

## 2022-09-29 DIAGNOSIS — J101 Influenza due to other identified influenza virus with other respiratory manifestations: Secondary | ICD-10-CM | POA: Diagnosis not present

## 2022-09-29 DIAGNOSIS — R6889 Other general symptoms and signs: Secondary | ICD-10-CM | POA: Diagnosis not present

## 2022-09-29 LAB — RESP PANEL BY RT-PCR (FLU A&B, COVID) ARPGX2
Influenza A by PCR: POSITIVE — AB
Influenza B by PCR: NEGATIVE
SARS Coronavirus 2 by RT PCR: NEGATIVE

## 2022-09-29 MED ORDER — OSELTAMIVIR PHOSPHATE 75 MG PO CAPS: 75.0000 mg | ORAL_CAPSULE | Freq: Two times a day (BID) | ORAL | 0 refills | Status: DC

## 2022-09-29 NOTE — Discharge Instructions (Signed)
ou have been diagnosed with a viral upper respiratory infection based on your symptoms and exam. Viral illnesses cannot be treated with antibiotics - they are self limiting - and you should find your symptoms resolving within a few days. Get plenty of rest and non-caffeinated fluids.  We have performed a respiratory swab checking for COVID, and influenza.  You have been treated with an antiviral medication for influenza based on a presumptive diagnosis.  You will receive a call once your results are available with instructions to continue or to discontinue this medication.     We recommend you use over-the-counter medications for symptom control including Tylenol or ibuprofen for fever, chills or body aches, and cold/cough medication.  Saline mist spray is helpful for removing excess mucus from your nose.  Room humidifiers are helpful to ease breathing at night. You might also find relief of nasal/sinus congestion symptoms by using a nasal decongestant such as Sudafed sinus (pseudoephedrine).  You will need to obtain this medication from behind the pharmacist counter.  Speak to the pharmacist to verify that you are not duplicating medications with other over-the-counter formulations that you may be using.   Follow up here or with your primary care provider if your symptoms are worsening or not improving.

## 2022-09-29 NOTE — ED Triage Notes (Signed)
Pt. Presents to UC w/ c/o a headache, body aches and chills that started today.

## 2022-09-29 NOTE — ED Provider Notes (Signed)
[name]    CSN: [id number] Arrival date & time: 09/29/22  1650      History   Chief Complaint Chief Complaint  Patient presents with   Headache    Not feeling well. I've been exposed to alot of sickness. - Entered by patient   Chills   Generalized Body Aches    HPI Allison Carney is a 36 y.o. female.    Headache   Presents to urgent care with complaint of headache, body aches, chills starting today.  Measured 100.9 F in clinic. Last dose of fever reducing meds was this morning.  Past Medical History:  Diagnosis Date   Migraine    approx 1x/month   Wears contact lenses     Patient Active Problem List   Diagnosis Date Noted   Family history of leukemia 09/14/2022   Viral URI with cough 09/14/2022   Rash and nonspecific skin eruption 04/07/2022   Routine general medical examination at a health care facility 06/25/2021   Oligohydramnios 05/11/2019   Post-dates pregnancy 05/11/2019   Pyelectasis of fetus on prenatal ultrasound 01/31/2019   Migraine with aura 08/29/2018   Elevated LDL cholesterol level 08/12/2014    Past Surgical History:  Procedure Laterality Date   APPENDECTOMY     FACIAL LACERATION REPAIR Left 10/28/2017   Procedure: LEFT EAR LOBE LACERATION REPAIR;  Surgeon: Linus Salmons, MD;  Location: [facility];  Service: ENT;  Laterality: Left;  LOCAL    OB History     Gravida  1   Para      Term      Preterm      AB      Living         SAB      IAB      Ectopic      Multiple      Live Births               Home Medications    Prior to Admission medications   Medication Sig Start Date End Date Taking? Authorizing Provider  Azelastine-Fluticasone 137-50 MCG/ACT SUSP Place 1 spray into the nose in the morning and at bedtime. 10/09/20   [provider]  levocetirizine (XYZAL) 5 MG tablet Take 1 tablet by mouth daily as needed. 10/09/20   [provider]  montelukast (SINGULAIR) 10  MG tablet Take 1 tablet by mouth at bedtime. 10/09/20   [provider]  Multiple Vitamins-Minerals (WOMENS MULTI PO) Take by mouth.    [provider]  ondansetron (ZOFRAN) 8 MG tablet TAKE 1 TABLET (8 MG TOTAL) BY MOUTH EVERY 8 (EIGHT) HOURS AS NEEDED FOR NAUSEA OR VOMITING. WITH MIGRAINE 11/23/21   Tower, Audrie Gallus, MD  SUMAtriptan (IMITREX) 100 MG tablet TAKE 1 TABLET (100 MG TOTAL) BY MOUTH EVERY 2 (TWO) HOURS AS NEEDED FOR MIGRAINE. MAY REPEAT IN 2 HOURS IF HEADACHE PERSISTS OR RECURS. 11/23/21   Tower, Audrie Gallus, MD    Family History Family History  Problem Relation Age of Onset   Healthy Mother    Healthy Father    Leukemia Son        at 62 1/2 y old    Breast cancer Neg Hx    Ovarian cancer Neg Hx    Colon cancer Neg Hx     Social History Social History   Tobacco Use   Smoking status: Never   Smokeless tobacco: Never  Vaping Use   Vaping Use: Never used  Substance Use Topics   Alcohol use: Not Currently   Drug use: No     Allergies   Patient has no known allergies.   Review of Systems Review of Systems  Neurological:  Positive for headaches.     Physical Exam Triage Vital Signs ED Triage Vitals  Enc Vitals Group     BP 09/29/22 1745 134/86     Pulse Rate 09/29/22 1745 87     Resp 09/29/22 1745 18     Temp 09/29/22 1745 (!) 100.9 F (38.3 C)     Temp src --      SpO2 09/29/22 1745 97 %     Weight --      Height --      Head Circumference --      Peak Flow --      Pain Score 09/29/22 1746 0     Pain Loc --      Pain Edu? --      Excl. in [place]? --    No data found.  Updated Vital Signs BP 134/86   Pulse 87   Temp (!) 100.9 F (38.3 C)   Resp 18   LMP 09/28/2022 (Approximate)   SpO2 97%   Breastfeeding No   Visual Acuity Right Eye Distance:   Left Eye Distance:   Bilateral Distance:    Right Eye Near:   Left Eye Near:    Bilateral Near:     Physical Exam Vitals reviewed.  Constitutional:      Appearance: She is  ill-appearing.  Pulmonary:     Effort: Pulmonary effort is normal.     Breath sounds: Normal breath sounds.  Neurological:     Mental Status: She is alert and oriented to person, place, and time.  Psychiatric:        Mood and Affect: Mood normal.        Behavior: Behavior normal.      UC Treatments / Results  Labs (all labs ordered are listed, but only abnormal results are displayed) Labs Reviewed - No data to display  EKG   Radiology No results found.  Procedures Procedures (including critical care time)  Medications Ordered in UC Medications - No data to display  Initial Impression / Assessment and Plan / UC Course  I have reviewed the triage vital signs and the nursing notes.  Pertinent labs & imaging results that were available during my care of the patient were reviewed by me and considered in my medical decision making (see chart for details).   Patient is afebrile here without recent antipyretics. Satting well on room air. Overall is ill appearing, though well hydrated and without respiratory distress. Pulmonary exam is unremarkable.   Suspect viral process including influenza or COVID.  Treating presumptively for influenza while awaiting results of respiratory swab.  Also recommending use of OTC medication for symptom control.  Final Clinical Impressions(s) / UC Diagnoses   Final diagnoses:  None   Discharge Instructions   None    ED Prescriptions   None    PDMP not reviewed this encounter.   Rose Phi, [place] 09/29/22 1807

## 2022-12-11 ENCOUNTER — Other Ambulatory Visit: Payer: Self-pay | Admitting: Family Medicine

## 2022-12-13 NOTE — Telephone Encounter (Signed)
LAST APPOINTMENT DATE: 09/14/2022   NEXT APPOINTMENT DATE: Visit date not found    LAST REFILL:11/23/2021   [id number]: 10 tablet    Refills: 11

## 2023-10-06 ENCOUNTER — Telehealth: Payer: Self-pay

## 2023-10-06 DIAGNOSIS — Z0000 Encounter for general adult medical examination without abnormal findings: Secondary | ICD-10-CM

## 2023-10-06 DIAGNOSIS — E7800 Pure hypercholesterolemia, unspecified: Secondary | ICD-10-CM

## 2023-10-06 NOTE — Telephone Encounter (Signed)
Copied from CRM [id number]. Topic: Clinical - Request for Lab/Test Order >> Oct 06, 2023  4:06 PM Orinda Kenner C wrote: Reason for CRM: CPX 10/14/23, pt would like to do labs before OV, can provider order labs. Pls advise, contact pt via MyChart.

## 2023-10-07 NOTE — Telephone Encounter (Signed)
The orders are in.

## 2023-10-07 NOTE — Telephone Encounter (Signed)
Called and scheduled patient appt

## 2023-10-10 ENCOUNTER — Other Ambulatory Visit (INDEPENDENT_AMBULATORY_CARE_PROVIDER_SITE_OTHER): Payer: BC Managed Care – PPO

## 2023-10-10 DIAGNOSIS — E7800 Pure hypercholesterolemia, unspecified: Secondary | ICD-10-CM

## 2023-10-10 DIAGNOSIS — Z0000 Encounter for general adult medical examination without abnormal findings: Secondary | ICD-10-CM

## 2023-10-10 LAB — CBC WITH DIFFERENTIAL/PLATELET
Basophils Absolute: 0 10*3/uL (ref 0.0–0.1)
Basophils Relative: 1 % (ref 0.0–3.0)
Eosinophils Absolute: 0.1 10*3/uL (ref 0.0–0.7)
Eosinophils Relative: 1.7 % (ref 0.0–5.0)
HCT: 41.9 % (ref 36.0–46.0)
Hemoglobin: 14.1 g/dL (ref 12.0–15.0)
Lymphocytes Relative: 34.9 % (ref 12.0–46.0)
Lymphs Abs: 1.3 10*3/uL (ref 0.7–4.0)
MCHC: 33.6 g/dL (ref 30.0–36.0)
MCV: 87.4 fL (ref 78.0–100.0)
Monocytes Absolute: 0.4 10*3/uL (ref 0.1–1.0)
Monocytes Relative: 10.3 % (ref 3.0–12.0)
Neutro Abs: 1.9 10*3/uL (ref 1.4–7.7)
Neutrophils Relative %: 52.1 % (ref 43.0–77.0)
Platelets: 245 10*3/uL (ref 150.0–400.0)
RBC: 4.8 Mil/uL (ref 3.87–5.11)
RDW: 13.6 % (ref 11.5–15.5)
WBC: 3.6 10*3/uL — ABNORMAL LOW (ref 4.0–10.5)

## 2023-10-10 LAB — LIPID PANEL
Cholesterol: 165 mg/dL (ref 0–200)
HDL: 59.6 mg/dL (ref >39.00)
LDL Cholesterol: 93 mg/dL (ref 0–99)
NonHDL: 105.43
Total CHOL/HDL Ratio: 3
Triglycerides: 62 mg/dL (ref 0.0–149.0)
VLDL: 12.4 mg/dL (ref 0.0–40.0)

## 2023-10-10 LAB — COMPREHENSIVE METABOLIC PANEL
ALT: 6 U/L (ref 0–35)
AST: 22 U/L (ref 0–37)
Albumin: 4.3 g/dL (ref 3.5–5.2)
Alkaline Phosphatase: 37 U/L — ABNORMAL LOW (ref 39–117)
BUN: 12 mg/dL (ref 6–23)
CO2: 29 meq/L (ref 19–32)
Calcium: 9.2 mg/dL (ref 8.4–10.5)
Chloride: 103 meq/L (ref 96–112)
Creatinine, Ser: 0.62 mg/dL (ref 0.40–1.20)
GFR: 114.57 mL/min (ref >60.00)
Glucose, Bld: 90 mg/dL (ref 70–99)
Potassium: 4.4 meq/L (ref 3.5–5.1)
Sodium: 137 meq/L (ref 135–145)
Total Bilirubin: 0.5 mg/dL (ref 0.2–1.2)
Total Protein: 6.9 g/dL (ref 6.0–8.3)

## 2023-10-10 LAB — TSH: TSH: 1.52 u[IU]/mL (ref 0.35–5.50)

## 2023-10-14 ENCOUNTER — Ambulatory Visit (INDEPENDENT_AMBULATORY_CARE_PROVIDER_SITE_OTHER): Payer: BC Managed Care – PPO | Admitting: Family Medicine

## 2023-10-14 ENCOUNTER — Encounter: Payer: Self-pay | Admitting: Family Medicine

## 2023-10-14 VITALS — BP 116/64 | HR 77 | Temp 97.9°F | Ht 61.5 in | Wt 114.0 lb

## 2023-10-14 DIAGNOSIS — Z8349 Family history of other endocrine, nutritional and metabolic diseases: Secondary | ICD-10-CM | POA: Insufficient documentation

## 2023-10-14 DIAGNOSIS — E7800 Pure hypercholesterolemia, unspecified: Secondary | ICD-10-CM | POA: Diagnosis not present

## 2023-10-14 DIAGNOSIS — F430 Acute stress reaction: Secondary | ICD-10-CM | POA: Insufficient documentation

## 2023-10-14 DIAGNOSIS — Z1159 Encounter for screening for other viral diseases: Secondary | ICD-10-CM | POA: Diagnosis not present

## 2023-10-14 DIAGNOSIS — Z0000 Encounter for general adult medical examination without abnormal findings: Secondary | ICD-10-CM | POA: Diagnosis not present

## 2023-10-14 NOTE — Assessment & Plan Note (Addendum)
Reviewed health habits including diet and exercise and skin cancer prevention Reviewed appropriate screening tests for age  Also reviewed health mt list, fam hx and immunization status , as well as social and family history   See HPI Labs reviewed and ordered Hep C screen today  Will return for gyn exam another time since menses today  Discussed fall prevention, supplements and exercise for bone density  PHQ 1, GAD 7   7 - in setting of stress/will return to discuss treatment opt  Health Maintenance  Topic Date Due   Hepatitis C Screening  Never done   Pap with HPV screening  10/31/2021   COVID-19 Vaccine (5 - 2024-25 season) 06/19/2023   DTaP/Tdap/Td vaccine (3 - Td or Tdap) 02/28/2029   Flu Shot  Completed   HIV Screening  Completed   HPV Vaccine  Aged Out

## 2023-10-14 NOTE — Patient Instructions (Addendum)
Schedule follow up for gyn exam/pap and to discuss mood   Keep seeing therapist   Keep up the good work with exercise  Make sure you eat enough to support (especially protein)   Labs look good Take care of yourself

## 2023-10-14 NOTE — Assessment & Plan Note (Signed)
Mcv is normal

## 2023-10-14 NOTE — Assessment & Plan Note (Signed)
Disc goals for lipids and reasons to control them Rev last labs with pt Rev low sat fat diet in detail Has fam history of high chol LDL down below 100 now  Will continue to watch

## 2023-10-14 NOTE — Progress Notes (Signed)
Subjective:    Patient ID: Allison Carney, female    DOB: [birth year], 37 y.o.   MRN: [id number]  HPI  Here for health maintenance exam and to review chronic medical problems   Wt Readings from Last 3 Encounters:  10/14/23 114 lb (51.7 kg)  09/14/22 110 lb 6 oz (50.1 kg)  04/07/22 118 lb (53.5 kg)   21.19 kg/m  Vitals:   10/14/23 0837  BP: 116/64  Pulse: 77  Temp: 97.9 F (36.6 C)  SpO2: 97%    Immunization History  Administered Date(s) Administered   Influenza, High Dose Seasonal PF 12/14/2016, 10/09/2020   Influenza,inj,Quad PF,6+ Mos 08/14/2015, 08/04/2017, 07/19/2018, 07/17/2019, 07/23/2020, 06/25/2021   Influenza-Unspecified 06/18/2022   Moderna Sars-Covid-2 Vaccination 12/15/2019, 01/12/2020, 10/09/2020   Pfizer(Comirnaty)Fall Seasonal Vaccine 12 years and older 06/18/2022   Tdap 03/29/2014, 03/01/2019    Health Maintenance Due  Topic Date Due   Hepatitis C Screening  Never done   Cervical Cancer Screening (HPV/Pap Cotest)  10/31/2021   COVID-19 Vaccine (5 - 2024-25 season) 06/19/2023   Hep C screen-interested    Self breast exam-no lumps  No family history of breast cancer   Gyn health Neg pap 10/2018 Menses now-will re schedule pap    Colon cancer screening , no family history  Fam history Son with leukemia at 48/2 yo   Bone health  Falls-has stumbled /no falls  Fractures-none  Supplements - mvi    Exercise  Works out 3 times per week  Runs 3 times per week- training for 1/2 marathon (end of feb)      Mood    10/14/2023    9:03 AM 04/07/2022    8:03 AM 06/25/2021   11:36 AM 04/05/2019   10:01 AM 08/28/2018   10:33 AM  Depression screen PHQ 2/9  Decreased Interest 0 0 0 0 0  Down, Depressed, Hopeless 0 0 0 0 0  PHQ - 2 Score 0 0 0 0 0  Altered sleeping 0  0    Tired, decreased energy 1  0    Change in appetite 0  0    Feeling bad or failure about yourself  0  0    Trouble concentrating 0  0    Moving slowly or fidgety/restless  0  1    Suicidal thoughts 0  0    PHQ-9 Score 1  1    Difficult doing work/chores Not difficult at all  Not difficult at all     GAD7 Score of 7   Still seeing her therapist  Will reach out to me  Has anxiety and OCD tendencies  No panic attack in a while  Is very high functioning overall   No trouble sleeping  Has been a bit tired recently - stress vs post viral   Appetite is ok    Son had 2nd bone marrow transplant   History of elevated cholesterol  Lab Results  Component Value Date   CHOL 165 10/10/2023   CHOL 194 09/08/2022   CHOL 203 (H) 06/25/2021   Lab Results  Component Value Date   HDL 59.60 10/10/2023   HDL 71.50 09/08/2022   HDL 64.60 06/25/2021   Lab Results  Component Value Date   LDLCALC 93 10/10/2023   LDLCALC 110 (H) 09/08/2022   LDLCALC 121 (H) 06/25/2021   Lab Results  Component Value Date   TRIG 62.0 10/10/2023   TRIG 59.0 09/08/2022   TRIG 85.0 06/25/2021   Lab Results  Component Value Date   CHOLHDL 3 10/10/2023   CHOLHDL 3 09/08/2022   CHOLHDL 3 06/25/2021   Lab Results  Component Value Date   LDLDIRECT 154.1 04/16/2013   Is eating better  Exercising also  Avoids red meat and fried food   Mother has high cholesterol    Other labs   Lab Results  Component Value Date   NA 137 10/10/2023   K 4.4 10/10/2023   CO2 29 10/10/2023   GLUCOSE 90 10/10/2023   BUN 12 10/10/2023   CREATININE 0.62 10/10/2023   CALCIUM 9.2 10/10/2023   GFR 114.57 10/10/2023   Lab Results  Component Value Date   ALT 6 10/10/2023   AST 22 10/10/2023   ALKPHOS 37 (L) 10/10/2023   BILITOT 0.5 10/10/2023   Lab Results  Component Value Date   WBC 3.6 (L) 10/10/2023   HGB 14.1 10/10/2023   HCT 41.9 10/10/2023   MCV 87.4 10/10/2023   PLT 245.0 10/10/2023   Lab Results  Component Value Date   TSH 1.52 10/10/2023    Family history of low B12      Patient Active Problem List   Diagnosis Date Noted   Encounter for hepatitis C screening  test for low risk patient 10/14/2023   Family history of B12 deficiency 10/14/2023   Stress reaction 10/14/2023   Family history of leukemia 09/14/2022   Routine general medical examination at a health care facility 06/25/2021   Oligohydramnios 05/11/2019   Post-dates pregnancy 05/11/2019   Pyelectasis of fetus on prenatal ultrasound 01/31/2019   Migraine with aura 08/29/2018   Elevated LDL cholesterol level 08/12/2014   Past Medical History:  Diagnosis Date   Migraine    approx 1x/month   Wears contact lenses    Past Surgical History:  Procedure Laterality Date   APPENDECTOMY     FACIAL LACERATION REPAIR Left 10/28/2017   Procedure: LEFT EAR LOBE LACERATION REPAIR;  Surgeon: Linus Salmons, MD;  Location: [facility];  Service: ENT;  Laterality: Left;  LOCAL   Social History   Tobacco Use   Smoking status: Never   Smokeless tobacco: Never  Vaping Use   Vaping status: Never Used  Substance Use Topics   Alcohol use: Not Currently   Drug use: No   Family History  Problem Relation Age of Onset   Healthy Mother    Healthy Father    Leukemia Son        at 26 1/2 y old    Breast cancer Neg Hx    Ovarian cancer Neg Hx    Colon cancer Neg Hx    No Known Allergies Current Outpatient Medications on File Prior to Visit  Medication Sig Dispense Refill   Azelastine-Fluticasone 137-50 MCG/ACT SUSP Place 1 spray into the nose in the morning and at bedtime.     levocetirizine (XYZAL) 5 MG tablet Take 1 tablet by mouth daily as needed.     montelukast (SINGULAIR) 10 MG tablet Take 1 tablet by mouth at bedtime.     Multiple Vitamins-Minerals (WOMENS MULTI PO) Take by mouth.     ondansetron (ZOFRAN) 8 MG tablet TAKE 1 TABLET (8 MG TOTAL) BY MOUTH EVERY 8 (EIGHT) HOURS AS NEEDED FOR NAUSEA OR VOMITING. WITH MIGRAINE 15 tablet 3   SUMAtriptan (IMITREX) 100 MG tablet TAKE 1 TABLET (100 MG TOTAL) BY MOUTH EVERY 2 (TWO) HOURS AS NEEDED FOR MIGRAINE. MAY REPEAT IN 2 HOURS IF  HEADACHE PERSISTS OR RECURS. 10 tablet 11  No current facility-administered medications on file prior to visit.    Review of Systems  Constitutional:  Positive for fatigue. Negative for activity change, appetite change, fever and unexpected weight change.  HENT:  Negative for congestion, ear pain, rhinorrhea, sinus pressure and sore throat.   Eyes:  Negative for pain, redness and visual disturbance.  Respiratory:  Negative for cough, shortness of breath and wheezing.   Cardiovascular:  Negative for chest pain and palpitations.  Gastrointestinal:  Negative for abdominal pain, blood in stool, constipation and diarrhea.  Endocrine: Negative for polydipsia and polyuria.  Genitourinary:  Negative for dysuria, frequency and urgency.  Musculoskeletal:  Negative for arthralgias, back pain and myalgias.  Skin:  Negative for pallor and rash.  Allergic/Immunologic: Negative for environmental allergies.  Neurological:  Negative for dizziness, syncope and headaches.  Hematological:  Negative for adenopathy. Does not bruise/bleed easily.  Psychiatric/Behavioral:  Positive for dysphoric mood. Negative for decreased concentration. The patient is nervous/anxious.        Objective:   Physical Exam Constitutional:      General: She is not in acute distress.    Appearance: Normal appearance. She is well-developed and normal weight. She is not ill-appearing or diaphoretic.  HENT:     Head: Normocephalic and atraumatic.     Right Ear: Tympanic membrane, ear canal and external ear normal.     Left Ear: Tympanic membrane, ear canal and external ear normal.     Nose: Nose normal. No congestion.     Mouth/Throat:     Mouth: Mucous membranes are moist.     Pharynx: Oropharynx is clear. No posterior oropharyngeal erythema.  Eyes:     General: No scleral icterus.    Extraocular Movements: Extraocular movements intact.     Conjunctiva/sclera: Conjunctivae normal.     Pupils: Pupils are equal, round, and  reactive to light.  Neck:     Thyroid: No thyromegaly.     Vascular: No carotid bruit or JVD.  Cardiovascular:     Rate and Rhythm: Normal rate and regular rhythm.     Pulses: Normal pulses.     Heart sounds: Normal heart sounds.     No gallop.  Pulmonary:     Effort: Pulmonary effort is normal. No respiratory distress.     Breath sounds: Normal breath sounds. No wheezing.     Comments: Good air exch Chest:     Chest wall: No tenderness.  Abdominal:     General: Bowel sounds are normal. There is no distension or abdominal bruit.     Palpations: Abdomen is soft. There is no mass.     Tenderness: There is no abdominal tenderness.     Hernia: No hernia is present.  Musculoskeletal:        General: No tenderness. Normal range of motion.     Cervical back: Normal range of motion and neck supple. No rigidity. No muscular tenderness.     Right lower leg: No edema.     Left lower leg: No edema.     Comments: No kyphosis   Lymphadenopathy:     Cervical: No cervical adenopathy.  Skin:    General: Skin is warm and dry.     Coloration: Skin is not pale.     Findings: No erythema or rash.     Comments: Solar lentigines diffusely   Neurological:     Mental Status: She is alert. Mental status is at baseline.     Cranial Nerves: No cranial nerve deficit.  Motor: No abnormal muscle tone.     Coordination: Coordination normal.     Gait: Gait normal.     Deep Tendon Reflexes: Reflexes are normal and symmetric. Reflexes normal.  Psychiatric:        Attention and Perception: Attention normal.        Mood and Affect: Mood is anxious (.).        Cognition and Memory: Cognition and memory normal.     Comments: Candidly discusses symptoms and stressors             Assessment & Plan:   Problem List Items Addressed This Visit       Other   Stress reaction   Son has leukemia/ recently had 2nd BM transplant Seeing counselor Reviewed stressors/ coping techniques/symptoms/ support  sources/ tx options and side effects in detail today  Will return to discuss treatment options  GAD7 score is elevated  Expecting letter/note from her counselor       Routine general medical examination at a health care facility - Primary   Reviewed health habits including diet and exercise and skin cancer prevention Reviewed appropriate screening tests for age  Also reviewed health mt list, fam hx and immunization status , as well as social and family history   See HPI Labs reviewed and ordered Hep C screen today  Will return for gyn exam another time since menses today  Discussed fall prevention, supplements and exercise for bone density  PHQ 1, GAD 7   7 - in setting of stress/will return to discuss treatment opt  Health Maintenance  Topic Date Due   Hepatitis C Screening  Never done   Pap with HPV screening  10/31/2021   COVID-19 Vaccine (5 - 2024-25 season) 06/19/2023   DTaP/Tdap/Td vaccine (3 - Td or Tdap) 02/28/2029   Flu Shot  Completed   HIV Screening  Completed   HPV Vaccine  Aged Out         Family history of B12 deficiency   Mcv is normal       Encounter for hepatitis C screening test for low risk patient   Hep C screen ordered Low risk      Relevant Orders   Hepatitis C Antibody   Elevated LDL cholesterol level   Disc goals for lipids and reasons to control them Rev last labs with pt Rev low sat fat diet in detail Has fam history of high chol LDL down below 100 now  Will continue to watch

## 2023-10-14 NOTE — Assessment & Plan Note (Signed)
Hep C screen ordered Low risk

## 2023-10-14 NOTE — Assessment & Plan Note (Signed)
Son has leukemia/ recently had 2nd BM transplant Seeing counselor Reviewed stressors/ coping techniques/symptoms/ support sources/ tx options and side effects in detail today  Will return to discuss treatment options  GAD7 score is elevated  Expecting letter/note from her counselor

## 2023-10-15 LAB — HEPATITIS C ANTIBODY: Hepatitis C Ab: NONREACTIVE

## 2023-10-25 ENCOUNTER — Encounter: Payer: Self-pay | Admitting: Family Medicine

## 2023-10-25 ENCOUNTER — Other Ambulatory Visit (HOSPITAL_COMMUNITY)
Admission: RE | Admit: 2023-10-25 | Discharge: 2023-10-25 | Disposition: A | Discharge status: Home / Self Care | Payer: 59 | Source: Ambulatory Visit | Attending: Family Medicine | Admitting: Family Medicine

## 2023-10-25 ENCOUNTER — Ambulatory Visit (INDEPENDENT_AMBULATORY_CARE_PROVIDER_SITE_OTHER): Payer: 59 | Admitting: Family Medicine

## 2023-10-25 VITALS — BP 98/64 | HR 77 | Temp 98.1°F | Ht 61.5 in | Wt 115.2 lb

## 2023-10-25 DIAGNOSIS — F430 Acute stress reaction: Secondary | ICD-10-CM | POA: Diagnosis not present

## 2023-10-25 DIAGNOSIS — G43109 Migraine with aura, not intractable, without status migrainosus: Secondary | ICD-10-CM | POA: Diagnosis not present

## 2023-10-25 DIAGNOSIS — Z01419 Encounter for gynecological examination (general) (routine) without abnormal findings: Secondary | ICD-10-CM | POA: Insufficient documentation

## 2023-10-25 MED ORDER — SERTRALINE HCL 50 MG PO TABS: ORAL_TABLET | ORAL | 1 refills | Status: DC

## 2023-10-25 MED ORDER — ONDANSETRON HCL 8 MG PO TABS: 8.0000 mg | ORAL_TABLET | Freq: Three times a day (TID) | ORAL | 3 refills | Status: AC | PRN

## 2023-10-25 MED ORDER — SUMATRIPTAN SUCCINATE 100 MG PO TABS: 100.0000 mg | ORAL_TABLET | ORAL | 11 refills | Status: AC | PRN

## 2023-10-25 NOTE — Progress Notes (Signed)
 Subjective:    Patient ID: Allison Carney, female    DOB: May 04, 1986, 38 y.o.   MRN: [id number]  HPI  Wt Readings from Last 3 Encounters:  10/25/23 115 lb 4 oz (52.3 kg)  10/14/23 114 lb (51.7 kg)  09/14/22 110 lb 6 oz (50.1 kg)   21.42 kg/m  Vitals:   10/25/23 1526  BP: 98/64  Pulse: 77  Temp: 98.1 F (36.7 C)  SpO2: 98%    Pt presents for gyn exam and pap  Also to discuss mood  Migraine follow up and refills    Due for routine gyn exam  Last visit had menses-needed to re schedule for pap   Last pap 10/2018 negative  Pap in 2017 was neg and had neg HPV  Periods are tolerable  Regular  Not too heavy or painful , avg 4 days   Does not want to start contraceptions Declines STD screening     Needs imitrex  and zofran  refilled for migraines   Mood     10/25/2023    3:36 PM 10/14/2023    9:03 AM 04/07/2022    8:03 AM 06/25/2021   11:36 AM 04/05/2019   10:01 AM  Depression screen PHQ 2/9  Decreased Interest 0 0 0 0 0  Down, Depressed, Hopeless 0 0 0 0 0  PHQ - 2 Score 0 0 0 0 0  Altered sleeping 0 0  0   Tired, decreased energy 0 1  0   Change in appetite 0 0  0   Feeling bad or failure about yourself  0 0  0   Trouble concentrating 0 0  0   Moving slowly or fidgety/restless 0 0  1   Suicidal thoughts 0 0  0   PHQ-9 Score 0 1  1   Difficult doing work/chores Not difficult at all Not difficult at all  Not difficult at all       10/25/2023    3:36 PM 10/14/2023    9:04 AM  GAD 7 : Generalized Anxiety Score  Nervous, Anxious, on Edge 1 1  Control/stop worrying 0 1  Worry too much - different things 1 1  Trouble relaxing 0 1  Restless 1 1  Easily annoyed or irritable 1 1  Afraid - awful might happen 1 1  Total GAD 7 Score 5 7  Anxiety Difficulty Not difficult at all Somewhat difficult    Stressors-son has leukemia and recent 2nd bone marrow transplant   Seeing a counselor  She is on vacation   Is a little less stressed than last time  Son is  doing a little better  He has appointment Thursday-hoping for good news    Unsure if she feels like she needs something medication wise  Feels better now   Has always been wired for anxiety  Some ocd tendencies  This may be a trauma response   Sleep is good  Appetite is pretty good kathyrn exercises      Last labs Lab Results  Component Value Date   NA 137 10/10/2023   K 4.4 10/10/2023   CO2 29 10/10/2023   GLUCOSE 90 10/10/2023   BUN 12 10/10/2023   CREATININE 0.62 10/10/2023   CALCIUM 9.2 10/10/2023   GFR 114.57 10/10/2023   Lab Results  Component Value Date   ALT 6 10/10/2023   AST 22 10/10/2023   ALKPHOS 37 (L) 10/10/2023   BILITOT 0.5 10/10/2023   Lab Results  Component Value Date  CHOL 165 10/10/2023   HDL 59.60 10/10/2023   LDLCALC 93 10/10/2023   LDLDIRECT 154.1 04/16/2013   TRIG 62.0 10/10/2023   CHOLHDL 3 10/10/2023   Lab Results  Component Value Date   WBC 3.6 (L) 10/10/2023   HGB 14.1 10/10/2023   HCT 41.9 10/10/2023   MCV 87.4 10/10/2023   PLT 245.0 10/10/2023   Lab Results  Component Value Date   TSH 1.52 10/10/2023   Hep C screen normal        Patient Active Problem List   Diagnosis Date Noted   Visit for routine gyn exam 10/25/2023   Encounter for hepatitis C screening test for low risk patient 10/14/2023   Family history of B12 deficiency 10/14/2023   Stress reaction 10/14/2023   Family history of leukemia 09/14/2022   Routine general medical examination at a health care facility 06/25/2021   Oligohydramnios 05/11/2019   Post-dates pregnancy 05/11/2019   Pyelectasis of fetus on prenatal ultrasound 01/31/2019   Migraine with aura 08/29/2018   Elevated LDL cholesterol level 08/12/2014   Past Medical History:  Diagnosis Date   Migraine    approx 1x/month   Wears contact lenses    Past Surgical History:  Procedure Laterality Date   APPENDECTOMY     FACIAL LACERATION REPAIR Left 10/28/2017   Procedure: LEFT EAR LOBE  LACERATION REPAIR;  Surgeon: Herminio Miu, MD;  Location: [facility];  Service: ENT;  Laterality: Left;  LOCAL   Social History   Tobacco Use   Smoking status: Never   Smokeless tobacco: Never  Vaping Use   Vaping status: Never Used  Substance Use Topics   Alcohol use: Not Currently   Drug use: No   Family History  Problem Relation Age of Onset   Healthy Mother    Healthy Father    Leukemia Son        at 66 1/2 y old    Breast cancer Neg Hx    Ovarian cancer Neg Hx    Colon cancer Neg Hx    No Known Allergies Current Outpatient Medications on File Prior to Visit  Medication Sig Dispense Refill   Azelastine-Fluticasone 137-50 MCG/ACT SUSP Place 1 spray into the nose in the morning and at bedtime.     levocetirizine (XYZAL ) 5 MG tablet Take 1 tablet by mouth daily as needed.     montelukast (SINGULAIR) 10 MG tablet Take 1 tablet by mouth at bedtime.     Multiple Vitamins-Minerals (WOMENS MULTI PO) Take by mouth.     No current facility-administered medications on file prior to visit.    Review of Systems  Constitutional:  Negative for activity change, appetite change, fatigue, fever and unexpected weight change.  HENT:  Negative for congestion, ear pain, rhinorrhea, sinus pressure and sore throat.   Eyes:  Negative for pain, redness and visual disturbance.  Respiratory:  Negative for cough, shortness of breath and wheezing.   Cardiovascular:  Negative for chest pain and palpitations.  Gastrointestinal:  Negative for abdominal pain, blood in stool, constipation and diarrhea.  Endocrine: Negative for polydipsia and polyuria.  Genitourinary:  Negative for dysuria, frequency and urgency.  Musculoskeletal:  Negative for arthralgias, back pain and myalgias.  Skin:  Negative for pallor and rash.  Allergic/Immunologic: Negative for environmental allergies.  Neurological:  Negative for dizziness, syncope and headaches.  Hematological:  Negative for adenopathy. Does not  bruise/bleed easily.  Psychiatric/Behavioral:  Positive for dysphoric mood. Negative for decreased concentration, sleep disturbance and suicidal  ideas. The patient is nervous/anxious.        Objective:   Physical Exam Exam conducted with a chaperone present.  Constitutional:      General: She is not in acute distress.    Appearance: Normal appearance. She is normal weight. She is not ill-appearing.  Cardiovascular:     Rate and Rhythm: Normal rate and regular rhythm.  Genitourinary:    Exam position: Supine.     Comments: Breast exam: No mass, nodules, thickening, tenderness, bulging, retraction, inflamation, nipple discharge or skin changes noted.  No axillary or clavicular LA.                  Anus appears normal w/o hemorrhoids or masses       External genitalia : nl appearance and hair distribution/no lesions       Urethral meatus : nl size, no lesions or prolapse       Urethra: no masses, tenderness or scarring      Bladder : no masses or tenderness       Vagina: nl general appearance, no discharge or  Lesions, no significant cystocele  or rectocele       Cervix: no lesions/ discharge or friability      Uterus: nl size, contour, position, and mobility (not fixed) , non tender      Adnexa : no masses, tenderness, enlargement or nodularity          Neurological:     Mental Status: She is alert.  Psychiatric:        Attention and Perception: Attention normal.        Mood and Affect: Mood normal.        Speech: Speech normal.        Behavior: Behavior normal.        Thought Content: Thought content normal.        Cognition and Memory: Cognition and memory normal.     Comments: Candidly discusses symptoms and stressors   Not overly anxious today           Assessment & Plan:   Problem List Items Addressed This Visit       Cardiovascular and Mediastinum   Migraine with aura   Refilled imitrex  100 mg and zofran  8 mg for prn use for migraine  Encouraged good  headache prevention habits  Considering sertraline  for mood/anxiety       Relevant Medications   SUMAtriptan  (IMITREX ) 100 MG tablet   sertraline  (ZOLOFT ) 50 MG tablet     Other   Visit for routine gyn exam - Primary   Exam and pap with hpv screen  No menstrual issues Pt does not desire contraception Declines std screen         Relevant Orders   Cytology - PAP([place])   Stress reaction   In setting of illness of child (leukemia with bone marrow transplant) Reviewed stressors/ coping techniques/symptoms/ support sources/ tx options and side effects in detail today Pt is seeing counselor  Considering medication  (lifelong issues with anx mood) Discussed options /ssri  Given prescription for sertraline  50 mg to start with 25 mg daily 1-2 wk then titrate to 50 if well tolerated Discussed expectations of SSRI medication including time to effectiveness and mechanism of action, also poss of side effects (early and late)- including mental fuzziness, weight or appetite change, nausea and poss of worse dep or anxiety (even suicidal thoughts)  Pt voiced understanding and will stop med and update if  this occurs   Plan to follow up 4-6 weeks after starting   Aware of potential interaction with imitrex , discussed what to watch for       Relevant Medications   sertraline  (ZOLOFT ) 50 MG tablet

## 2023-10-25 NOTE — Assessment & Plan Note (Signed)
 Exam and pap with hpv screen  No menstrual issues Pt does not desire contraception Declines std screen

## 2023-10-25 NOTE — Patient Instructions (Addendum)
 Keep exercising  Take care of yourself the best you can Continue counseling   If you want to start generic zoloft  start with 1/2 pill each evening  Then if well tolerated after 1-2  weeks can go up to 1 pill daily  Follow up here in 4-6 weeks   Pap and exam today

## 2023-10-25 NOTE — Assessment & Plan Note (Signed)
 Refilled imitrex 100 mg and zofran 8 mg for prn use for migraine  Encouraged good headache prevention habits  Considering sertraline for mood/anxiety

## 2023-10-25 NOTE — Assessment & Plan Note (Signed)
 In setting of illness of child (leukemia with bone marrow transplant) Reviewed stressors/ coping techniques/symptoms/ support sources/ tx options and side effects in detail today Pt is seeing counselor  Considering medication  (lifelong issues with anx mood) Discussed options /ssri  Given prescription for sertraline  50 mg to start with 25 mg daily 1-2 wk then titrate to 50 if well tolerated Discussed expectations of SSRI medication including time to effectiveness and mechanism of action, also poss of side effects (early and late)- including mental fuzziness, weight or appetite change, nausea and poss of worse dep or anxiety (even suicidal thoughts)  Pt voiced understanding and will stop med and update if this occurs   Plan to follow up 4-6 weeks after starting   Aware of potential interaction with imitrex , discussed what to watch for

## 2023-10-27 LAB — CYTOLOGY - PAP
Comment: NEGATIVE
Diagnosis: NEGATIVE
High risk HPV: NEGATIVE

## 2023-12-28 ENCOUNTER — Encounter: Payer: Self-pay | Admitting: Family Medicine

## 2024-01-25 ENCOUNTER — Ambulatory Visit: Admitting: Family Medicine

## 2024-01-25 ENCOUNTER — Encounter: Payer: Self-pay | Admitting: Family Medicine

## 2024-01-25 VITALS — BP 102/64 | HR 59 | Temp 97.9°F | Ht 61.5 in | Wt 113.4 lb

## 2024-01-25 DIAGNOSIS — F430 Acute stress reaction: Secondary | ICD-10-CM | POA: Diagnosis not present

## 2024-01-25 MED ORDER — SERTRALINE HCL 50 MG PO TABS: ORAL_TABLET | ORAL | 3 refills | Status: DC

## 2024-01-25 MED ORDER — SERTRALINE HCL 50 MG PO TABS
50.0000 mg | ORAL_TABLET | Freq: Every day | ORAL | 3 refills | Status: AC
Start: 2024-01-25 — End: ?

## 2024-01-25 NOTE — Patient Instructions (Signed)
 Continue counseling   Continue sertraline 50 mg daily  If any problems or if symptoms worsen let me know    Take care of yourself  Self care is important when you can get a chance

## 2024-01-25 NOTE — Progress Notes (Signed)
 Subjective:    Patient ID: Allison Carney, female    DOB: [birth year], 38 y.o.   MRN: [id number]  HPI  Wt Readings from Last 3 Encounters:  01/25/24 113 lb 6 oz (51.4 kg)  10/25/23 115 lb 4 oz (52.3 kg)  10/14/23 114 lb (51.7 kg)   21.08 kg/m  Vitals:   01/25/24 1557  BP: 102/64  Pulse: (!) 59  Temp: 97.9 F (36.6 C)  SpO2: 100%    Pt presents for follow up of stress reaction with anxious mood   Stressors   Seeing counselor   We started sertraline 25 mg to titrate to 50  Did not start it right away   Thinks it is helping  Easier for her to "stop spinning"  Husband noticed also   Not very anxious  Not hopeless Able to get joy out of things   Able to stop the intrusive thoughts or compulsion   Energy level is good  Had a few days of fatigue and that got better  Better sleep and appetite   Son is doing a little better  She handles things better   May have some sexual side effects at first  Not too bothersome           Patient Active Problem List   Diagnosis Date Noted   Visit for routine gyn exam 10/25/2023   Encounter for hepatitis C screening test for low risk patient 10/14/2023   Family history of B12 deficiency 10/14/2023   Stress reaction 10/14/2023   Family history of leukemia 09/14/2022   Routine general medical examination at a health care facility 06/25/2021   Oligohydramnios 05/11/2019   Post-dates pregnancy 05/11/2019   Pyelectasis of fetus on prenatal ultrasound 01/31/2019   Migraine with aura 08/29/2018   Elevated LDL cholesterol level 08/12/2014   Past Medical History:  Diagnosis Date   Migraine    approx 1x/month   Wears contact lenses    Past Surgical History:  Procedure Laterality Date   APPENDECTOMY     FACIAL LACERATION REPAIR Left 10/28/2017   Procedure: LEFT EAR LOBE LACERATION REPAIR;  Surgeon: Linus Salmons, MD;  Location: [facility];  Service: ENT;  Laterality: Left;  LOCAL   Social History    Tobacco Use   Smoking status: Never   Smokeless tobacco: Never  Vaping Use   Vaping status: Never Used  Substance Use Topics   Alcohol use: Not Currently   Drug use: No   Family History  Problem Relation Age of Onset   Healthy Mother    Healthy Father    Leukemia Son        at 46 1/2 y old    Breast cancer Neg Hx    Ovarian cancer Neg Hx    Colon cancer Neg Hx    No Known Allergies Current Outpatient Medications on File Prior to Visit  Medication Sig Dispense Refill   Azelastine-Fluticasone 137-50 MCG/ACT SUSP Place 1 spray into the nose in the morning and at bedtime.     levocetirizine (XYZAL) 5 MG tablet Take 1 tablet by mouth daily as needed.     montelukast (SINGULAIR) 10 MG tablet Take 1 tablet by mouth at bedtime.     Multiple Vitamins-Minerals (WOMENS MULTI PO) Take by mouth.     ondansetron (ZOFRAN) 8 MG tablet Take 1 tablet (8 mg total) by mouth every 8 (eight) hours as needed for nausea or vomiting. With migraine 15 tablet 3   SUMAtriptan (IMITREX) 100  MG tablet Take 1 tablet (100 mg total) by mouth every 2 (two) hours as needed for migraine. May repeat in 2 hours if headache persists or recurs. 10 tablet 11   No current facility-administered medications on file prior to visit.    Review of Systems  Constitutional:  Negative for activity change, appetite change, fatigue, fever and unexpected weight change.  HENT:  Negative for congestion, ear pain, rhinorrhea, sinus pressure and sore throat.   Eyes:  Negative for pain, redness and visual disturbance.  Respiratory:  Negative for cough, shortness of breath and wheezing.   Cardiovascular:  Negative for chest pain and palpitations.  Gastrointestinal:  Negative for abdominal pain, blood in stool, constipation and diarrhea.  Endocrine: Negative for polydipsia and polyuria.  Genitourinary:  Negative for dysuria, frequency and urgency.  Musculoskeletal:  Negative for arthralgias, back pain and myalgias.  Skin:  Negative  for pallor and rash.  Allergic/Immunologic: Negative for environmental allergies.  Neurological:  Negative for dizziness, syncope and headaches.  Hematological:  Negative for adenopathy. Does not bruise/bleed easily.  Psychiatric/Behavioral:  Positive for dysphoric mood. Negative for decreased concentration, self-injury and sleep disturbance. The patient is nervous/anxious.        Improved anx and dep symptoms overall        Objective:   Physical Exam Constitutional:      General: She is not in acute distress.    Appearance: Normal appearance. She is well-developed and normal weight. She is not ill-appearing or diaphoretic.  HENT:     Head: Normocephalic and atraumatic.  Eyes:     Conjunctiva/sclera: Conjunctivae normal.     Pupils: Pupils are equal, round, and reactive to light.  Neck:     Thyroid: No thyromegaly.     Vascular: No carotid bruit or JVD.  Cardiovascular:     Rate and Rhythm: Normal rate and regular rhythm.     Heart sounds: Normal heart sounds.     No gallop.  Pulmonary:     Effort: Pulmonary effort is normal. No respiratory distress.     Breath sounds: Normal breath sounds. No wheezing or rales.  Abdominal:     General: There is no distension or abdominal bruit.     Palpations: Abdomen is soft.  Musculoskeletal:     Cervical back: Normal range of motion and neck supple.     Right lower leg: No edema.     Left lower leg: No edema.  Lymphadenopathy:     Cervical: No cervical adenopathy.  Skin:    General: Skin is warm and dry.     Coloration: Skin is not pale.     Findings: No rash.  Neurological:     Mental Status: She is alert.     Motor: No weakness.     Coordination: Coordination normal.     Gait: Gait normal.     Deep Tendon Reflexes: Reflexes are normal and symmetric. Reflexes normal.     Comments: No tremor   Psychiatric:        Mood and Affect: Mood normal.           Assessment & Plan:   Problem List Items Addressed This Visit        Other   Stress reaction - Primary   Overall significant improvement with sertraline 50 mg daily and counseling Reviewed stressors/ coping techniques/symptoms/ support sources/ tx options and side effects in detail today  Plans to continue current dose  Reviewed potential side effects to watch for  Has had some sexual side effects but they improved  Update if not continuing to improve       Relevant Medications   sertraline (ZOLOFT) 50 MG tablet

## 2024-01-25 NOTE — Assessment & Plan Note (Signed)
 Overall significant improvement with sertraline 50 mg daily and counseling Reviewed stressors/ coping techniques/symptoms/ support sources/ tx options and side effects in detail today  Plans to continue current dose  Reviewed potential side effects to watch for  Has had some sexual side effects but they improved  Update if not continuing to improve

## 2024-02-29 ENCOUNTER — Telehealth: Admitting: Physician Assistant

## 2024-02-29 DIAGNOSIS — B9689 Other specified bacterial agents as the cause of diseases classified elsewhere: Secondary | ICD-10-CM | POA: Diagnosis not present

## 2024-02-29 DIAGNOSIS — J019 Acute sinusitis, unspecified: Secondary | ICD-10-CM

## 2024-02-29 MED ORDER — AMOXICILLIN-POT CLAVULANATE 875-125 MG PO TABS: 1.0000 | ORAL_TABLET | Freq: Two times a day (BID) | ORAL | 0 refills | Status: DC

## 2024-02-29 NOTE — Progress Notes (Signed)
 I have spent 5 minutes in review of e-visit questionnaire, review and updating patient chart, medical decision making and response to patient.   Piedad Climes, PA-C

## 2024-02-29 NOTE — Progress Notes (Signed)

## 2024-07-02 ENCOUNTER — Telehealth: Admitting: Physician Assistant

## 2024-07-02 DIAGNOSIS — K130 Diseases of lips: Secondary | ICD-10-CM

## 2024-07-03 ENCOUNTER — Telehealth: Admitting: Family Medicine

## 2024-07-03 DIAGNOSIS — B001 Herpesviral vesicular dermatitis: Secondary | ICD-10-CM | POA: Diagnosis not present

## 2024-07-03 MED ORDER — VALACYCLOVIR HCL 1 G PO TABS: 2000.0000 mg | ORAL_TABLET | Freq: Two times a day (BID) | ORAL | 0 refills | Status: AC

## 2024-07-03 NOTE — Progress Notes (Signed)
 Virtual Visit Consent   Allison Carney, you are scheduled for a virtual visit with a [facility] provider today. Just as with appointments in the office, your consent must be obtained to participate. Your consent will be active for this visit and any virtual visit you may have with one of our providers in the next 365 days. If you have a MyChart account, a copy of this consent can be sent to you electronically.  As this is a virtual visit, video technology does not allow for your provider to perform a traditional examination. This may limit your provider's ability to fully assess your condition. If your provider identifies any concerns that need to be evaluated in person or the need to arrange testing (such as labs, EKG, etc.), we will make arrangements to do so. Although advances in technology are sophisticated, we cannot ensure that it will always work on either your end or our end. If the connection with a video visit is poor, the visit may have to be switched to a telephone visit. With either a video or telephone visit, we are not always able to ensure that we have a secure connection.  By engaging in this virtual visit, you consent to the provision of healthcare and authorize for your insurance to be billed (if applicable) for the services provided during this visit. Depending on your insurance coverage, you may receive a charge related to this service.  I need to obtain your verbal consent now. Are you willing to proceed with your visit today? Allison Carney has provided verbal consent on 07/03/2024 for a virtual visit (video or telephone). Allison CHRISTELLA Barefoot, NP  Date: 07/03/2024 3:24 PM   Virtual Visit via Video Note   I, Allison Carney, connected with  Allison Carney  ([id number], 1986/07/20) on 07/03/24 at  3:30 PM EDT by a video-enabled telemedicine application and verified that I am speaking with the correct person using two identifiers.  Location: Patient: Virtual Visit Location Patient:  Home Provider: Virtual Visit Location Provider: Home Office   I discussed the limitations of evaluation and management by telemedicine and the availability of in person appointments. The patient expressed understanding and agreed to proceed.    History of Present Illness: Allison Carney is a 38 y.o. who identifies as a female who was assigned female at birth, and is being seen today for cold sore  Onset was less than a week, 5 days ago Associated symptoms are tingling (just started) and redness of pimple like area top upper lip  Modifying factors are none Denies chest pain, shortness of breath, fevers, chills, URI symptoms   Problems:  Patient Active Problem List   Diagnosis Date Noted   Visit for routine gyn exam 10/25/2023   Encounter for hepatitis C screening test for low risk patient 10/14/2023   Family history of B12 deficiency 10/14/2023   Stress reaction 10/14/2023   Family history of leukemia 09/14/2022   Routine general medical examination at a health care facility 06/25/2021   Oligohydramnios 05/11/2019   Post-dates pregnancy 05/11/2019   Pyelectasis of fetus on prenatal ultrasound 01/31/2019   Migraine with aura 08/29/2018   Elevated LDL cholesterol level 08/12/2014    Allergies: No Known Allergies Medications:  Current Outpatient Medications:    amoxicillin -clavulanate (AUGMENTIN ) 875-125 MG tablet, Take 1 tablet by mouth 2 (two) times daily., Disp: 14 tablet, Rfl: 0   Azelastine-Fluticasone 137-50 MCG/ACT SUSP, Place 1 spray into the nose in the morning and at bedtime.,  Disp: , Rfl:    levocetirizine (XYZAL ) 5 MG tablet, Take 1 tablet by mouth daily as needed., Disp: , Rfl:    montelukast (SINGULAIR) 10 MG tablet, Take 1 tablet by mouth at bedtime., Disp: , Rfl:    Multiple Vitamins-Minerals (WOMENS MULTI PO), Take by mouth., Disp: , Rfl:    ondansetron  (ZOFRAN ) 8 MG tablet, Take 1 tablet (8 mg total) by mouth every 8 (eight) hours as needed for nausea or vomiting.  With migraine, Disp: 15 tablet, Rfl: 3   sertraline  (ZOLOFT ) 50 MG tablet, Take 1 tablet (50 mg total) by mouth daily., Disp: 90 tablet, Rfl: 3   SUMAtriptan  (IMITREX ) 100 MG tablet, Take 1 tablet (100 mg total) by mouth every 2 (two) hours as needed for migraine. May repeat in 2 hours if headache persists or recurs., Disp: 10 tablet, Rfl: 11  Observations/Objective: Patient is well-developed, well-nourished in no acute distress.  Resting comfortably  at home.  Head is normocephalic, atraumatic.  No labored breathing.  Speech is clear and coherent with logical content.  Patient is alert and oriented at baseline.    Assessment and Plan:    1. Cold sore (Primary)  - valACYclovir  (VALTREX ) 1000 MG tablet; Take 2 tablets (2,000 mg total) by mouth 2 (two) times daily for 1 day.  Dispense: 4 tablet; Refill: 0   -never had prior, but appears and sounds like cold sore- will give treatment and advised follow up with PCP if not improved or returns  - info on AVS  DDx- imeptigo (no drainage or blister, pain)   Reviewed side effects, risks and benefits of medication.    Patient acknowledged agreement and understanding of the plan.   Past Medical, Surgical, Social History, Allergies, and Medications have been Reviewed.   Follow Up Instructions: I discussed the assessment and treatment plan with the patient. The patient was provided an opportunity to ask questions and all were answered. The patient agreed with the plan and demonstrated an understanding of the instructions.  A copy of instructions were sent to the patient via MyChart unless otherwise noted below.    The patient was advised to call back or seek an in-person evaluation if the symptoms worsen or if the condition fails to improve as anticipated.    Allison CHRISTELLA Barefoot, NP

## 2024-07-03 NOTE — Patient Instructions (Signed)
 Allison Carney Monday, thank you for joining Allison CHRISTELLA Barefoot, NP for today's virtual visit.  While this provider is not your primary care provider (PCP), if your PCP is located in our provider database this encounter information will be shared with them immediately following your visit.   A [name] MyChart account gives you access to today's visit and all your visits, tests, and labs performed at [facility]  click here if you don't have a [facility] MyChart account or go to [email or web address]  Consent: (Patient) Allison Carney provided verbal consent for this virtual visit at the beginning of the encounter.  Current Medications:  Current Outpatient Medications:    valACYclovir  (VALTREX ) 1000 MG tablet, Take 2 tablets (2,000 mg total) by mouth 2 (two) times daily for 1 day., Disp: 4 tablet, Rfl: 0   amoxicillin -clavulanate (AUGMENTIN ) 875-125 MG tablet, Take 1 tablet by mouth 2 (two) times daily., Disp: 14 tablet, Rfl: 0   Azelastine-Fluticasone 137-50 MCG/ACT SUSP, Place 1 spray into the nose in the morning and at bedtime., Disp: , Rfl:    levocetirizine (XYZAL ) 5 MG tablet, Take 1 tablet by mouth daily as needed., Disp: , Rfl:    montelukast (SINGULAIR) 10 MG tablet, Take 1 tablet by mouth at bedtime., Disp: , Rfl:    Multiple Vitamins-Minerals (WOMENS MULTI PO), Take by mouth., Disp: , Rfl:    ondansetron  (ZOFRAN ) 8 MG tablet, Take 1 tablet (8 mg total) by mouth every 8 (eight) hours as needed for nausea or vomiting. With migraine, Disp: 15 tablet, Rfl: 3   sertraline  (ZOLOFT ) 50 MG tablet, Take 1 tablet (50 mg total) by mouth daily., Disp: 90 tablet, Rfl: 3   SUMAtriptan  (IMITREX ) 100 MG tablet, Take 1 tablet (100 mg total) by mouth every 2 (two) hours as needed for migraine. May repeat in 2 hours if headache persists or recurs., Disp: 10 tablet, Rfl: 11   Medications ordered in this encounter:  Meds ordered this encounter  Medications   valACYclovir  (VALTREX ) 1000 MG  tablet    Sig: Take 2 tablets (2,000 mg total) by mouth 2 (two) times daily for 1 day.    Dispense:  4 tablet    Refill:  0    Supervising Provider:   LAMPTEY, PHILIP O [[id number]]     *If you need refills on other medications prior to your next appointment, please contact your pharmacy*  Follow-Up: Call back or seek an in-person evaluation if the symptoms worsen or if the condition fails to improve as anticipated.  [facility] [phone]  Other Instructions Cold Sore  A cold sore, also called a fever blister, is a small, fluid-filled sore that forms inside the mouth or on the lips, gums, nose, chin, or cheeks. Cold sores can spread to other parts of the body, such as the eyes, fingers, or genitals. Cold sores can spread from person to person (are contagious) until the sores crust over completely. Most cold sores go away within 2 weeks. What are the causes? Cold sores are caused by an infection from a common type of herpes simplex virus (HSV-1). HSV-1 is closely related to the HSV-2virus, which is the virus that causes genital herpes, but these viruses are not the same. Once a person is infected with HSV-1, the virus remains permanently in the body. HSV-1 is spread from person to person through close contact, such as through kissing, touching the affected area, or sharing personal items such as lip balm, razors, a drinking glass,  or eating utensils. What increases the risk? You are more likely to develop this condition if you: Are tired, stressed, or sick. Are menstruating. Are pregnant. Take certain medicines. Are exposed to cold weather or too much sun. What are the signs or symptoms? Symptoms of a cold sore outbreak go through different stages. These are the stages of a cold sore: Tingling, itching, or burning is felt 1-2 days before the outbreak. Fluid-filled blisters appear on the lips, inside the mouth, on the nose, or on the cheeks. The blisters start to ooze  clear fluid. The blisters dry up, and a yellow crust appears in their place. The crust falls off. In some cases, other symptoms can develop during a cold sore outbreak. These can include: Fever. Sore throat. Headache. Muscle aches. Swollen neck glands. How is this diagnosed? This condition is diagnosed based on your medical history and a physical exam. Your health care provider may do a blood test or may swab some fluid from your sore and then examine the swab in the lab. How is this treated? There is no cure for cold sores or HSV-1. There is also no vaccine for HSV-1. Most cold sores go away on their own without treatment within 2 weeks. Medicines cannot make the infection go away, but your health care provider may prescribe medicines to: Help relieve some of the pain associated with the sores. Work to stop the virus from multiplying. Shorten healing time. Medicines may be in the form of creams, gels, pills, or a shot. Follow these instructions at home: Medicines Take or apply over-the-counter and prescription medicines only as told by your health care provider. Use a cotton-tip swab to apply creams or gels to your sores. Ask your health care provider if you can take lysine supplements. Research has found that lysine may help heal the cold sore faster and prevent outbreaks. Sore care  Do not touch the sores or pick the scabs. Wash your hands often with soap and water for at least 20 seconds. Do not touch your eyes without washing your hands first. Keep the sores clean and dry. If directed, put ice on the sores. To do this: Put ice in a plastic bag. Place a towel between your skin and the bag. Leave the ice on for 20 minutes, 2-3 times a day. Remove the ice if your skin turns bright red. This is very important. If you cannot feel pain, heat, or cold, you have a greater risk of damage to the area. Eating and drinking Eat a soft, bland diet. Avoid eating hot, cold, or salty foods. Use  a straw if it hurts to drink out of a glass. Eat foods that are rich in lysine, such as meat, fish, and dairy products. Avoid sugary foods, chocolates, nuts, and grains. These foods are rich in a nutrient called arginine, which can cause the virus to multiply. Lifestyle Do not kiss, have oral sex, or share personal items until your sores heal. Stress, poor sleep, and being out in the sun can trigger outbreaks. Make sure you: Do activities that help you relax, such as deep breathing exercises or meditation. Get enough sleep. Apply sunscreen on your lips before you go out in the sun. Contact a health care provider if: You have symptoms for more than 2 weeks. You have pus coming from the sores. You have redness that is spreading. You have pain or irritation in your eye. You get sores on your genitals. Your sores do not heal within 2 weeks.  You have frequent cold sore outbreaks. Get help right away if: You have a fever and your symptoms suddenly get worse. You have a headache and confusion. You have tiredness (fatigue) or loss of appetite. You have a stiff neck or sensitivity to light. Summary A cold sore, also called a fever blister, is a small, fluid-filled sore that forms inside the mouth or on the lips, gums, nose, chin, or cheeks. Most cold sores go away on their own without treatment within 2 weeks. Your health care provider may prescribe medicines to help relieve some of the pain, work to stop the virus from multiplying, and shorten healing time. Wash your hands often with soap and water for at least 20 seconds. Do not touch your eyes without washing your hands first. Do not kiss, have oral sex, or share personal items until your sores heal. Contact a health care provider if your sores do not heal within 2 weeks. This information is not intended to replace advice given to you by your health care provider. Make sure you discuss any questions you have with your health care  provider. Document Revised: 07/15/2021 Document Reviewed: 07/15/2021 Elsevier Patient Education  2024 [employer]   If you have been instructed to have an in-person evaluation today at a local Urgent Care facility, please use the link below. It will take you to a list of all of our available [facility], including address, phone number and hours of operation. Please do not delay care.  [facility]  If you or a family member do not have a primary care provider, use the link below to schedule a visit and establish care. When you choose a [facility] primary care physician or advanced practice provider, you gain a long-term partner in health. Find a Primary Care Provider  Learn more about [facility]'s in-office and virtual care options:  - Get Care Now

## 2024-07-03 NOTE — Progress Notes (Signed)
   Thank you for the details you included in the comment boxes. Those details are very helpful in determining the best course of treatment for you and help us  to provide the best care.Because you have no history of cold sores/fever blisters, we recommend that you schedule a Virtual Urgent Care video visit in order for the provider to better assess what is going on.  The provider will be able to give you a more accurate diagnosis and treatment plan if we can more freely discuss your symptoms and with the addition of a virtual examination.   If you change your visit to a video visit, we will bill your insurance (similar to an office visit) and you will not be charged for this e-Visit. You will be able to stay at home and speak with the first available [facility] advanced practice provider. The link to do a video visit is in the drop down Menu tab of your Welcome screen in MyChart.

## 2024-09-27 ENCOUNTER — Encounter: Payer: Self-pay | Admitting: Emergency Medicine

## 2024-09-27 ENCOUNTER — Ambulatory Visit
Admission: EM | Admit: 2024-09-27 | Discharge: 2024-09-27 | Disposition: A | Discharge status: Home / Self Care | Attending: Emergency Medicine | Admitting: Emergency Medicine

## 2024-09-27 ENCOUNTER — Ambulatory Visit: Payer: Self-pay

## 2024-09-27 DIAGNOSIS — J069 Acute upper respiratory infection, unspecified: Secondary | ICD-10-CM | POA: Diagnosis not present

## 2024-09-27 DIAGNOSIS — J029 Acute pharyngitis, unspecified: Secondary | ICD-10-CM

## 2024-09-27 LAB — POCT RAPID STREP A (OFFICE): Rapid Strep A Screen: NEGATIVE

## 2024-09-27 LAB — POC COVID19/FLU A&B COMBO
Covid Antigen, POC: NEGATIVE
Influenza A Antigen, POC: NEGATIVE
Influenza B Antigen, POC: NEGATIVE

## 2024-09-27 MED ORDER — LIDOCAINE VISCOUS HCL 2 % MT SOLN: 15.0000 mL | OROMUCOSAL | 0 refills | Status: AC | PRN

## 2024-09-27 NOTE — ED Provider Notes (Signed)
 [name]    CSN: [id number] Arrival date & time: 09/27/24  0944      History   Chief Complaint Chief Complaint  Patient presents with   Sore Throat   Fever   Nasal Congestion    HPI Allison Carney is a 38 y.o. female.   Patient presents for evaluation of a fever peaking at 100.4, nasal congestion, bilateral ear pain, intermittent sinus pressure to the cheeks, sore throat and a nonproductive cough present for 2 days.  Throat pain worsening over the last 24 hours making it difficult to sleep.  Tolerable to food and liquids but appetite is decreased.  Has attempted use of Tylenol  and Mucinex.  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  Denies shortness of breath or wheezing.  Denies respiratory history non-smoker.  Past Medical History:  Diagnosis Date   Migraine    approx 1x/month   Wears contact lenses     Patient Active Problem List   Diagnosis Date Noted   Visit for routine gyn exam 10/25/2023   Encounter for hepatitis C screening test for low risk patient 10/14/2023   Family history of B12 deficiency 10/14/2023   Stress reaction 10/14/2023   Family history of leukemia 09/14/2022   Routine general medical examination at a health care facility 06/25/2021   Oligohydramnios 05/11/2019   Post-dates pregnancy 05/11/2019   Pyelectasis of fetus on prenatal ultrasound 01/31/2019   Migraine with aura 08/29/2018   Elevated LDL cholesterol level 08/12/2014    Past Surgical History:  Procedure Laterality Date   APPENDECTOMY     FACIAL LACERATION REPAIR Left 10/28/2017   Procedure: LEFT EAR LOBE LACERATION REPAIR;  Surgeon: Herminio Miu, MD;  Location: [facility];  Service: ENT;  Laterality: Left;  LOCAL    OB History     Gravida  1   Para      Term      Preterm      AB      Living         SAB      IAB      Ectopic      Multiple      Live Births               Home Medications    Prior to  Admission medications  Medication Sig Start Date End Date Taking? Authorizing Provider  amoxicillin -clavulanate (AUGMENTIN ) 875-125 MG tablet Take 1 tablet by mouth 2 (two) times daily. 02/29/24   Gladis Elsie BROCKS, PA-C  Azelastine-Fluticasone 137-50 MCG/ACT SUSP Place 1 spray into the nose in the morning and at bedtime. 10/09/20   [provider]  levocetirizine (XYZAL ) 5 MG tablet Take 1 tablet by mouth daily as needed. 10/09/20   [provider]  montelukast (SINGULAIR) 10 MG tablet Take 1 tablet by mouth at bedtime. 10/09/20   [provider]  Multiple Vitamins-Minerals (WOMENS MULTI PO) Take by mouth.    [provider]  ondansetron  (ZOFRAN ) 8 MG tablet Take 1 tablet (8 mg total) by mouth every 8 (eight) hours as needed for nausea or vomiting. With migraine 10/25/23   Tower, Laine LABOR, MD  sertraline  (ZOLOFT ) 50 MG tablet Take 1 tablet (50 mg total) by mouth daily. 01/25/24   Tower, Laine LABOR, MD  SUMAtriptan  (IMITREX ) 100 MG tablet Take 1 tablet (100 mg total) by mouth every 2 (two) hours as needed for migraine. May repeat in 2 hours if headache persists  or recurs. 10/25/23   Tower, Laine LABOR, MD    Family History Family History  Problem Relation Age of Onset   Healthy Mother    Healthy Father    Leukemia Son        at 28 1/2 y old    Breast cancer Neg Hx    Ovarian cancer Neg Hx    Colon cancer Neg Hx     Social History Social History[1]   Allergies   Patient has no known allergies.   Review of Systems Review of Systems  Constitutional:  Positive for fever. Negative for activity change, appetite change, chills, diaphoresis, fatigue and unexpected weight change.  HENT:  Positive for congestion, ear pain and sore throat. Negative for dental problem, drooling, ear discharge, facial swelling, hearing loss, mouth sores, nosebleeds, postnasal drip, rhinorrhea, sinus pressure, sinus pain, sneezing, tinnitus, trouble swallowing and voice change.    Respiratory:  Positive for cough. Negative for apnea, choking, chest tightness, shortness of breath, wheezing and stridor.   Cardiovascular: Negative.   Gastrointestinal: Negative.   Neurological: Negative.      Physical Exam Triage Vital Signs ED Triage Vitals  Encounter Vitals Group     BP 09/27/24 1123 117/80     Girls Systolic BP Percentile --      Girls Diastolic BP Percentile --      Boys Systolic BP Percentile --      Boys Diastolic BP Percentile --      Pulse Rate 09/27/24 1123 82     Resp 09/27/24 1123 18     Temp 09/27/24 1123 99 F (37.2 C)     Temp Source 09/27/24 1123 Oral     SpO2 09/27/24 1123 98 %     Weight --      Height --      Head Circumference --      Peak Flow --      Pain Score 09/27/24 1120 7     Pain Loc --      Pain Education --      Exclude from Growth Chart --    No data found.  Updated Vital Signs BP 117/80 (BP Location: Left Arm)   Pulse 82   Temp 99 F (37.2 C) (Oral)   Resp 18   LMP 09/24/2024 (Exact Date)   SpO2 98%   Visual Acuity Right Eye Distance:   Left Eye Distance:   Bilateral Distance:    Right Eye Near:   Left Eye Near:    Bilateral Near:     Physical Exam Constitutional:      Appearance: Normal appearance.  HENT:     Right Ear: Tympanic membrane, ear canal and external ear normal.     Left Ear: Tympanic membrane, ear canal and external ear normal.     Nose: Congestion present.     Mouth/Throat:     Pharynx: Posterior oropharyngeal erythema present. No oropharyngeal exudate.  Cardiovascular:     Rate and Rhythm: Normal rate and regular rhythm.     Pulses: Normal pulses.     Heart sounds: Normal heart sounds.  Pulmonary:     Effort: Pulmonary effort is normal.     Breath sounds: Normal breath sounds.  Neurological:     Mental Status: She is alert and oriented to person, place, and time. Mental status is at baseline.      UC Treatments / Results  Labs (all labs ordered are listed, but only abnormal  results are displayed) Labs  Reviewed  POCT RAPID STREP A (OFFICE)    EKG   Radiology No results found.  Procedures Procedures (including critical care time)  Medications Ordered in UC Medications - No data to display  Initial Impression / Assessment and Plan / UC Course  I have reviewed the triage vital signs and the nursing notes.  Pertinent labs & imaging results that were available during my care of the patient were reviewed by me and considered in my medical decision making (see chart for details).  Viral URI with cough, sore throat  Patient is in no signs of distress nor toxic appearing.  Vital signs are stable.  Low suspicion for pneumonia, pneumothorax or bronchitis and therefore will defer imaging.  COVID flu and strep testing negative.  Prescribed prednisone  and viscous lidocaine .May use additional over-the-counter medications as needed for supportive care.  May follow-up with urgent care as needed if symptoms persist or worsen.  Note given.   Final Clinical Impressions(s) / UC Diagnoses   Final diagnoses:  Sore throat   Discharge Instructions   None    ED Prescriptions   None    PDMP not reviewed this encounter.     [1]  Social History Tobacco Use   Smoking status: Never   Smokeless tobacco: Never  Vaping Use   Vaping status: Never Used  Substance Use Topics   Alcohol use: Not Currently   Drug use: No     Teresa Shelba SAUNDERS, NP 09/27/24 1221

## 2024-09-27 NOTE — Discharge Instructions (Signed)
 Your symptoms today are most likely being caused by a virus and should steadily improve in time it can take up to 7 to 10 days before you truly start to see a turnaround however things will get better  COVID flu and strep testing negative  Begin prednisone  every morning with food to reduce inflammation and help with pain throughout the body, will help with sinus pressure as well as sore throat  You may gargle and spit lidocaine  solution Abrol temporary relief to your throat    You can take Tylenol  and/or Ibuprofen  as needed for fever reduction and pain relief.   For cough: honey 1/2 to 1 teaspoon (you can dilute the honey in water or another fluid).  You can also use guaifenesin and dextromethorphan for cough. You can use a humidifier for chest congestion and cough.  If you don't have a humidifier, you can sit in the bathroom with the hot shower running.      For sore throat: try warm salt water gargles, cepacol lozenges, throat spray, warm tea or water with lemon/honey, popsicles or ice, or OTC cold relief medicine for throat discomfort.   For congestion: take a daily anti-histamine like Zyrtec, Claritin, and a oral decongestant, such as pseudoephedrine.  You can also use Flonase 1-2 sprays in each nostril daily.   It is important to stay hydrated: drink plenty of fluids (water, gatorade/powerade/pedialyte, juices, or teas) to keep your throat moisturized and help further relieve irritation/discomfort.

## 2024-09-27 NOTE — Telephone Encounter (Signed)
 FYI Only or Action Required?: FYI only for provider: Referred to UC.  Patient was last seen in primary care on 07/03/2024 by Moishe Chiquita HERO, NP.  Called Nurse Triage reporting Sore Throat.  Symptoms began yesterday.  Interventions attempted: OTC medications: Mucinex, Tylenol  .  Symptoms are: unchanged.  Triage Disposition: See PCP When Office is Open (Within 3 Days)  Patient/caregiver understands and will follow disposition?: Yes          Message from Franky GRADE sent at 09/27/2024  8:04 AM EST  Reason for Triage: Patient woke up this morning with a really bad sore throat, heavy congestion and a fever of 100.   Reason for Disposition  [1] Sore throat with cough/cold symptoms AND [2] present > 5 days  Answer Assessment - Initial Assessment Questions 1. ONSET: When did the throat start hurting? (Hours or days ago)      Yesterday   2. SEVERITY: How bad is the sore throat? (Scale 1-10; mild, moderate or severe)     Moderate   3. STREP EXPOSURE: Has there been any exposure to strep within the past week? If Yes, ask: What type of contact occurred?      Unsure  4.  VIRAL SYMPTOMS: Are there any symptoms of a cold, such as a runny nose, cough, hoarse voice or red eyes?      Cough, runny nose   5. FEVER: Do you have a fever? If Yes, ask: What is your temperature, how was it measured, and when did it start?     Low grade 100.4 this morning   6. PUS ON THE TONSILS: Is there pus on the tonsils in the back of your throat?     No   7. OTHER SYMPTOMS: Do you have any other symptoms? (e.g., difficulty breathing, headache, rash)     No   8. PREGNANCY: Is there any chance you are pregnant? When was your last menstrual period?     No   Patient called in to triage with complaints of congestion, sore throat, fever. This has been ongoing since yesterday, congestion a few days ago The patient stated her son is recovering from Pneumonia. For home care, the  patient is taking OTC Tylenol , Mucinex   Patient will be seen in UC due to lack of appointment availability per Epic. for further evaluation; and agrees with the plan of care, and will reach out if symptoms worsen or persist.  Protocols used: Sore Throat-A-AH

## 2024-09-27 NOTE — Telephone Encounter (Signed)
 Aware, will watch for correspondence Agree with UC disposition

## 2024-09-27 NOTE — ED Triage Notes (Signed)
 Patient reports sore throat, fever and nasal congestion x 2 days. Patient took mucinex with no relief. Rates sore throat pain 7/10.

## 2024-10-03 ENCOUNTER — Ambulatory Visit: Admitting: Family Medicine

## 2024-10-03 ENCOUNTER — Encounter: Payer: Self-pay | Admitting: Family Medicine

## 2024-10-03 VITALS — BP 120/78 | HR 87 | Temp 98.9°F | Ht 61.5 in | Wt 115.2 lb

## 2024-10-03 DIAGNOSIS — J019 Acute sinusitis, unspecified: Secondary | ICD-10-CM | POA: Diagnosis not present

## 2024-10-03 MED ORDER — AMOXICILLIN-POT CLAVULANATE 875-125 MG PO TABS: 1.0000 | ORAL_TABLET | Freq: Two times a day (BID) | ORAL | 0 refills | Status: AC

## 2024-10-03 NOTE — Assessment & Plan Note (Signed)
 Treat for bacterial sinusitis given duration and progression of symptoms, deterioration after initial improvement.  Did not respond to oral prednisone  course through [facility].  Rx augmentin  10d course. Supportive measures reviewed. Update if not improving with treatment. Pt agrees with plan.

## 2024-10-03 NOTE — Patient Instructions (Signed)
You have a sinus infection. Take medicine as prescribed: augmentin  Push fluids and plenty of rest. Nasal saline irrigation or neti pot to help drain sinuses. May use plain mucinex with plenty of fluid to help mobilize mucous. Please let us know if fever >101.5, trouble opening/closing mouth, difficulty swallowing, or worsening instead of improving as expected.

## 2024-10-03 NOTE — Progress Notes (Signed)
 Ph: [phone] Fax: [phone]   Patient ID: Allison Carney, female    DOB: 07-06-1986, 38 y.o.   MRN: [id number]  This visit was conducted in person.  BP 120/78   Pulse 87   Temp 98.9 F (37.2 C) (Oral)   Ht 5' 1.5 (1.562 m)   Wt 115 lb 3.2 oz (52.3 kg)   LMP 09/24/2024 (Exact Date)   SpO2 98%   BMI 21.41 kg/m    CC: cough, loss of smell , sinus pain  Subjective:   HPI: Allison Carney is a 38 y.o. female presenting on 10/03/2024 for Acute Visit (Dry cough, unable to smell, headache and painful sinus pressure, congestion//Was treated on 12/11 at [facility], was prescribed prednisone  and viscous lidocaine , but has still had no relief since finishing medication)   1 wk h/o URI symptoms.   Seen at [facility] last week, treated for viral URI with prednisone  and viscous lidocaine  - tested negative for strep throat, COVID and flu. Throat does feel better. Also tried tylenol , dayquil, astelin/fluticasone nasal spray. No improvement with [facility] treatment.   Today started with worsening sinus pressure, tooth ache, loss of smell, nasal obstruction. Cough, nasal discharge productive of colored mucous, some blood.   No ear pain, fevers/chills, abd pain, nausea, diarrhea, wheezing ,chest pain or dyspnea.   + sick household.  Husband had cold - now better. Son with pneumonia recently.   Non smoker No h/o asthma +allergic rhinitis - managed with singulair, xyzal , astelin-fluticasone nasal spray.   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      Relevant past medical, surgical, family and social history reviewed and updated as indicated. Interim medical history since our last visit reviewed. Allergies and medications reviewed and updated. Outpatient Medications Prior to Visit  Medication Sig Dispense Refill   Azelastine-Fluticasone 137-50 MCG/ACT SUSP Place 1 spray into the nose in the morning and at bedtime.     levocetirizine (XYZAL ) 5 MG tablet Take 1 tablet by mouth daily as needed.     montelukast  (SINGULAIR) 10 MG tablet Take 1 tablet by mouth at bedtime.     Multiple Vitamins-Minerals (WOMENS MULTI PO) Take by mouth.     ondansetron  (ZOFRAN ) 8 MG tablet Take 1 tablet (8 mg total) by mouth every 8 (eight) hours as needed for nausea or vomiting. With migraine 15 tablet 3   sertraline  (ZOLOFT ) 50 MG tablet Take 1 tablet (50 mg total) by mouth daily. 90 tablet 3   SUMAtriptan  (IMITREX ) 100 MG tablet Take 1 tablet (100 mg total) by mouth every 2 (two) hours as needed for migraine. May repeat in 2 hours if headache persists or recurs. 10 tablet 11   lidocaine  (XYLOCAINE ) 2 % solution Use as directed 15 mLs in the mouth or throat every 4 (four) hours as needed. (Patient not taking: Reported on 10/03/2024) 100 mL 0   amoxicillin -clavulanate (AUGMENTIN ) 875-125 MG tablet Take 1 tablet by mouth 2 (two) times daily. 14 tablet 0   predniSONE  (STERAPRED UNI-PAK 21 TAB) 10 MG (21) TBPK tablet Take by mouth daily. Take 6 tabs by mouth daily  for 1 days, then 5 tabs for 1 days, then 4 tabs for 1 days, then 3 tabs for 1 days, 2 tabs for 1 days, then 1 tab by mouth daily for 1 days (Patient not taking: Reported on 10/03/2024) 21 tablet 0   No facility-administered medications prior to visit.     Per HPI unless specifically indicated in ROS section below Review of Systems  Objective:  BP 120/78   Pulse 87   Temp 98.9 F (37.2 C) (Oral)   Ht 5' 1.5 (1.562 m)   Wt 115 lb 3.2 oz (52.3 kg)   LMP 09/24/2024 (Exact Date)   SpO2 98%   BMI 21.41 kg/m   Wt Readings from Last 3 Encounters:  10/03/24 115 lb 3.2 oz (52.3 kg)  01/25/24 113 lb 6 oz (51.4 kg)  10/25/23 115 lb 4 oz (52.3 kg)      Physical Exam Vitals and nursing note reviewed.  Constitutional:      Appearance: Normal appearance. She is not ill-appearing.     Comments: Tired appearing  HENT:     Head: Normocephalic and atraumatic.     Right Ear: Tympanic membrane, ear canal and external ear normal. There is no impacted cerumen.      Left Ear: Tympanic membrane, ear canal and external ear normal. There is no impacted cerumen.     Nose: Mucosal edema, congestion (marked nasal congestion) and rhinorrhea present.     Right Turbinates: Enlarged and swollen. Not pale.     Left Turbinates: Enlarged and swollen. Not pale.     Right Sinus: Maxillary sinus tenderness and frontal sinus tenderness present.     Left Sinus: Maxillary sinus tenderness and frontal sinus tenderness present.     Mouth/Throat:     Mouth: Mucous membranes are moist.     Pharynx: Oropharynx is clear. No oropharyngeal exudate or posterior oropharyngeal erythema.  Eyes:     Extraocular Movements: Extraocular movements intact.     Conjunctiva/sclera: Conjunctivae normal.     Pupils: Pupils are equal, round, and reactive to light.  Cardiovascular:     Rate and Rhythm: Normal rate and regular rhythm.     Pulses: Normal pulses.     Heart sounds: Normal heart sounds. No murmur heard. Pulmonary:     Effort: Pulmonary effort is normal. No respiratory distress.     Breath sounds: Normal breath sounds. No wheezing, rhonchi or rales.     Comments: Lungs clear  Musculoskeletal:     Cervical back: Normal range of motion and neck supple.  Lymphadenopathy:     Head:     Right side of head: No submental, submandibular, tonsillar, preauricular or posterior auricular adenopathy.     Left side of head: No submental, submandibular, tonsillar, preauricular or posterior auricular adenopathy.     Cervical: No cervical adenopathy.     Right cervical: No superficial cervical adenopathy.    Left cervical: No superficial cervical adenopathy.     Upper Body:     Right upper body: No supraclavicular adenopathy.     Left upper body: No supraclavicular adenopathy.  Skin:    Findings: No rash.  Neurological:     Mental Status: She is alert.  Psychiatric:        Mood and Affect: Mood normal.        Behavior: Behavior normal.       Results for orders placed or performed  during the hospital encounter of 09/27/24  POCT rapid strep A   Collection Time: 09/27/24 11:37 AM  Result Value Ref Range   Rapid Strep A Screen Negative Negative  POC Covid19/Flu A&B Antigen   Collection Time: 09/27/24 12:04 PM  Result Value Ref Range   Influenza A Antigen, POC Negative Negative   Influenza B Antigen, POC Negative Negative   Covid Antigen, POC Negative Negative    Assessment & Plan:   Problem List Items Addressed  This Visit     Acute sinusitis - Primary   Treat for bacterial sinusitis given duration and progression of symptoms, deterioration after initial improvement.  Did not respond to oral prednisone  course through [facility]  Rx augmentin  10d course. Supportive measures reviewed. Update if not improving with treatment. Pt agrees with plan.       Relevant Medications   amoxicillin -clavulanate (AUGMENTIN ) 875-125 MG tablet     Meds ordered this encounter  Medications   amoxicillin -clavulanate (AUGMENTIN ) 875-125 MG tablet    Sig: Take 1 tablet by mouth 2 (two) times daily for 10 days.    Dispense:  20 tablet    Refill:  0    No orders of the defined types were placed in this encounter.   Patient Instructions  You have a sinus infection. Take medicine as prescribed: augmentin   Push fluids and plenty of rest. Nasal saline irrigation or neti pot to help drain sinuses. May use plain mucinex with plenty of fluid to help mobilize mucous. Please let us  know if fever >101.5, trouble opening/closing mouth, difficulty swallowing, or worsening instead of improving as expected.   Follow up plan: Return if symptoms worsen or fail to improve.  Anton Blas, MD

## 2024-11-05 ENCOUNTER — Encounter: Payer: Self-pay | Admitting: Family Medicine

## 2024-11-05 MED ORDER — OSELTAMIVIR PHOSPHATE 75 MG PO CAPS: 75.0000 mg | ORAL_CAPSULE | Freq: Every day | ORAL | 0 refills | Status: AC
